# Patient Record
Sex: Male | Born: 1955 | Race: White | Hispanic: No | Smoking: Current every day smoker
Health system: Southern US, Community
[De-identification: ages and names within clinical notes are randomized; demographics above are authoritative.]

## PROBLEM LIST (undated history)

## (undated) DIAGNOSIS — K7460 Unspecified cirrhosis of liver: Secondary | ICD-10-CM

## (undated) DIAGNOSIS — R270 Ataxia, unspecified: Secondary | ICD-10-CM

## (undated) DIAGNOSIS — B1920 Unspecified viral hepatitis C without hepatic coma: Secondary | ICD-10-CM

## (undated) DIAGNOSIS — I10 Essential (primary) hypertension: Secondary | ICD-10-CM

## (undated) HISTORY — DX: Ataxia, unspecified: R27.0

## (undated) HISTORY — DX: Unspecified viral hepatitis C without hepatic coma: B19.20

## (undated) HISTORY — DX: Porphyria cutanea tarda: E80.1

## (undated) HISTORY — DX: Unspecified cirrhosis of liver: K74.60

## (undated) HISTORY — DX: Essential (primary) hypertension: I10

---

## 2014-05-07 ENCOUNTER — Encounter (INDEPENDENT_AMBULATORY_CARE_PROVIDER_SITE_OTHER): Payer: Self-pay | Admitting: *Deleted

## 2014-08-05 ENCOUNTER — Ambulatory Visit (INDEPENDENT_AMBULATORY_CARE_PROVIDER_SITE_OTHER): Payer: Self-pay | Admitting: Internal Medicine

## 2014-09-09 ENCOUNTER — Telehealth (INDEPENDENT_AMBULATORY_CARE_PROVIDER_SITE_OTHER): Payer: Self-pay | Admitting: *Deleted

## 2014-09-09 ENCOUNTER — Encounter (INDEPENDENT_AMBULATORY_CARE_PROVIDER_SITE_OTHER): Payer: Self-pay | Admitting: *Deleted

## 2014-09-09 NOTE — Telephone Encounter (Signed)
Ebon No Showed for his apt with Dr. Laural Golden on 08/05/14. A NS letter has been mailed.

## 2014-09-09 NOTE — Telephone Encounter (Signed)
Noted  

## 2014-10-23 ENCOUNTER — Encounter (INDEPENDENT_AMBULATORY_CARE_PROVIDER_SITE_OTHER): Payer: Self-pay | Admitting: *Deleted

## 2014-11-08 ENCOUNTER — Encounter (INDEPENDENT_AMBULATORY_CARE_PROVIDER_SITE_OTHER): Payer: Self-pay | Admitting: *Deleted

## 2014-11-08 ENCOUNTER — Encounter (INDEPENDENT_AMBULATORY_CARE_PROVIDER_SITE_OTHER): Payer: Self-pay | Admitting: Internal Medicine

## 2014-11-08 ENCOUNTER — Ambulatory Visit (INDEPENDENT_AMBULATORY_CARE_PROVIDER_SITE_OTHER): Payer: 59 | Admitting: Internal Medicine

## 2014-11-08 DIAGNOSIS — B1920 Unspecified viral hepatitis C without hepatic coma: Secondary | ICD-10-CM

## 2014-11-08 DIAGNOSIS — I10 Essential (primary) hypertension: Secondary | ICD-10-CM | POA: Insufficient documentation

## 2014-11-08 LAB — HEPATIC FUNCTION PANEL
ALT: 171 U/L — AB (ref 0–53)
AST: 145 U/L — ABNORMAL HIGH (ref 0–37)
Albumin: 3.9 g/dL (ref 3.5–5.2)
Alkaline Phosphatase: 85 U/L (ref 39–117)
BILIRUBIN DIRECT: 0.3 mg/dL (ref 0.0–0.3)
Indirect Bilirubin: 0.7 mg/dL (ref 0.2–1.2)
TOTAL PROTEIN: 7.4 g/dL (ref 6.0–8.3)
Total Bilirubin: 1 mg/dL (ref 0.2–1.2)

## 2014-11-08 LAB — CBC WITH DIFFERENTIAL/PLATELET
BASOS PCT: 2 % — AB (ref 0–1)
Basophils Absolute: 0.1 10*3/uL (ref 0.0–0.1)
EOS PCT: 1 % (ref 0–5)
Eosinophils Absolute: 0.1 10*3/uL (ref 0.0–0.7)
HEMATOCRIT: 45.7 % (ref 39.0–52.0)
HEMOGLOBIN: 16.1 g/dL (ref 13.0–17.0)
Lymphocytes Relative: 40 % (ref 12–46)
Lymphs Abs: 2.5 10*3/uL (ref 0.7–4.0)
MCH: 32.9 pg (ref 26.0–34.0)
MCHC: 35.2 g/dL (ref 30.0–36.0)
MCV: 93.3 fL (ref 78.0–100.0)
MPV: 11.9 fL (ref 8.6–12.4)
Monocytes Absolute: 0.9 10*3/uL (ref 0.1–1.0)
Monocytes Relative: 14 % — ABNORMAL HIGH (ref 3–12)
NEUTROS PCT: 43 % (ref 43–77)
Neutro Abs: 2.7 10*3/uL (ref 1.7–7.7)
PLATELETS: 174 10*3/uL (ref 150–400)
RBC: 4.9 MIL/uL (ref 4.22–5.81)
RDW: 13.5 % (ref 11.5–15.5)
WBC: 6.2 10*3/uL (ref 4.0–10.5)

## 2014-11-08 LAB — PROTIME-INR
INR: 1.09 (ref <1.50)
Prothrombin Time: 14.1 seconds (ref 11.6–15.2)

## 2014-11-08 NOTE — Patient Instructions (Signed)
Labs today. OV pending.
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
## 2014-11-09 LAB — AFP TUMOR MARKER: AFP-Tumor Marker: 13.8 ng/mL — ABNORMAL HIGH (ref <6.1)

## 2014-11-11 LAB — HEPATITIS C RNA QUANTITATIVE
HCV QUANT: 434504 [IU]/mL — AB (ref <15)
HCV Quantitative Log: 5.64 {Log} — ABNORMAL HIGH (ref <1.18)

## 2014-11-12 ENCOUNTER — Other Ambulatory Visit (HOSPITAL_COMMUNITY): Payer: Self-pay | Admitting: Family Medicine

## 2014-11-13 ENCOUNTER — Encounter (INDEPENDENT_AMBULATORY_CARE_PROVIDER_SITE_OTHER): Payer: Self-pay

## 2014-11-13 ENCOUNTER — Ambulatory Visit (HOSPITAL_COMMUNITY): Admission: RE | Admit: 2014-11-13 | Payer: 59 | Source: Ambulatory Visit

## 2014-11-14 ENCOUNTER — Ambulatory Visit (HOSPITAL_COMMUNITY)
Admission: RE | Admit: 2014-11-14 | Discharge: 2014-11-14 | Disposition: A | Discharge status: Home / Self Care | Payer: 59 | Source: Ambulatory Visit | Attending: Internal Medicine | Admitting: Internal Medicine

## 2014-11-14 DIAGNOSIS — B1920 Unspecified viral hepatitis C without hepatic coma: Secondary | ICD-10-CM | POA: Insufficient documentation

## 2015-03-20 ENCOUNTER — Encounter (INDEPENDENT_AMBULATORY_CARE_PROVIDER_SITE_OTHER): Payer: Self-pay | Admitting: Internal Medicine

## 2015-03-20 ENCOUNTER — Ambulatory Visit (INDEPENDENT_AMBULATORY_CARE_PROVIDER_SITE_OTHER): Payer: 59 | Admitting: Internal Medicine

## 2015-03-20 VITALS — BP 140/82 | HR 80 | Temp 98.1°F | Ht 73.0 in | Wt 198.8 lb

## 2015-03-20 DIAGNOSIS — B1710 Acute hepatitis C without hepatic coma: Secondary | ICD-10-CM | POA: Diagnosis not present

## 2015-03-20 LAB — CBC WITH DIFFERENTIAL/PLATELET
BASOS PCT: 1 % (ref 0–1)
Basophils Absolute: 0.1 10*3/uL (ref 0.0–0.1)
EOS ABS: 0.1 10*3/uL (ref 0.0–0.7)
Eosinophils Relative: 1 % (ref 0–5)
HCT: 46 % (ref 39.0–52.0)
Hemoglobin: 16 g/dL (ref 13.0–17.0)
Lymphocytes Relative: 36 % (ref 12–46)
Lymphs Abs: 2.4 10*3/uL (ref 0.7–4.0)
MCH: 32.7 pg (ref 26.0–34.0)
MCHC: 34.8 g/dL (ref 30.0–36.0)
MCV: 94.1 fL (ref 78.0–100.0)
MPV: 11.8 fL (ref 8.6–12.4)
Monocytes Absolute: 1.2 10*3/uL — ABNORMAL HIGH (ref 0.1–1.0)
Monocytes Relative: 18 % — ABNORMAL HIGH (ref 3–12)
NEUTROS ABS: 2.9 10*3/uL (ref 1.7–7.7)
Neutrophils Relative %: 44 % (ref 43–77)
Platelets: 161 10*3/uL (ref 150–400)
RBC: 4.89 MIL/uL (ref 4.22–5.81)
RDW: 13.6 % (ref 11.5–15.5)
WBC: 6.6 10*3/uL (ref 4.0–10.5)

## 2015-03-20 LAB — HEPATIC FUNCTION PANEL
ALK PHOS: 84 U/L (ref 40–115)
ALT: 123 U/L — ABNORMAL HIGH (ref 9–46)
AST: 106 U/L — ABNORMAL HIGH (ref 10–35)
Albumin: 4.2 g/dL (ref 3.6–5.1)
BILIRUBIN DIRECT: 0.3 mg/dL — AB (ref <=0.2)
BILIRUBIN TOTAL: 0.8 mg/dL (ref 0.2–1.2)
Indirect Bilirubin: 0.5 mg/dL (ref 0.2–1.2)
Total Protein: 7.9 g/dL (ref 6.1–8.1)

## 2015-03-20 NOTE — Patient Instructions (Signed)
OV pending.  
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
## 2015-03-21 LAB — DRUG SCREEN, URINE
AMPHETAMINE SCRN UR: NEGATIVE
BENZODIAZEPINES.: NEGATIVE
Barbiturate Quant, Ur: NEGATIVE
Cocaine Metabolites: NEGATIVE
Creatinine,U: 17.86 mg/dL
Marijuana Metabolite: POSITIVE — AB
Methadone: NEGATIVE
Opiates: NEGATIVE
PHENCYCLIDINE (PCP): NEGATIVE
Propoxyphene: NEGATIVE

## 2015-03-21 LAB — HEPATITIS B SURFACE ANTIGEN: Hepatitis B Surface Ag: NEGATIVE

## 2015-03-23 LAB — HEPATITIS C RNA QUANTITATIVE
HCV QUANT: 197984 [IU]/mL — AB (ref <15)
HCV Quantitative Log: 5.3 {Log} — ABNORMAL HIGH (ref <1.18)

## 2015-03-24 ENCOUNTER — Telehealth (INDEPENDENT_AMBULATORY_CARE_PROVIDER_SITE_OTHER): Payer: Self-pay | Admitting: Internal Medicine

## 2015-03-24 NOTE — Telephone Encounter (Signed)
Will either treat with Harvoni x 12 weeks or Viekira x 12 weeks. Which ever his insurance will pay for

## 2015-03-26 NOTE — Telephone Encounter (Signed)
I have completed a PA to be sent to [place] [facility], so that they can complete the PA process. Once they have processed the request and let us know the status, we will let the patient know.

## 2015-05-02 ENCOUNTER — Telehealth (INDEPENDENT_AMBULATORY_CARE_PROVIDER_SITE_OTHER): Payer: Self-pay | Admitting: *Deleted

## 2015-05-02 ENCOUNTER — Telehealth (INDEPENDENT_AMBULATORY_CARE_PROVIDER_SITE_OTHER): Payer: Self-pay | Admitting: Internal Medicine

## 2015-05-02 DIAGNOSIS — R825 Elevated urine levels of drugs, medicaments and biological substances: Secondary | ICD-10-CM

## 2015-05-02 DIAGNOSIS — B182 Chronic viral hepatitis C: Secondary | ICD-10-CM

## 2015-05-02 NOTE — Telephone Encounter (Signed)
Spoke with patient. He had a positive drug screen. Will repeat drug screen in November (30 days). If negative, will resubmit to [facility] with Epclusa x 12 weeks.

## 2015-05-02 NOTE — Telephone Encounter (Signed)
Urine drug test noted for 1 month.

## 2015-05-02 NOTE — Telephone Encounter (Signed)
.  Per [name] patient to have a repeat urine drug screen in 30 days. If negative, we will resubmit and request Epclusa for 12 weeks.

## 2015-05-08 ENCOUNTER — Encounter (INDEPENDENT_AMBULATORY_CARE_PROVIDER_SITE_OTHER): Payer: Self-pay | Admitting: *Deleted

## 2015-05-08 ENCOUNTER — Other Ambulatory Visit (INDEPENDENT_AMBULATORY_CARE_PROVIDER_SITE_OTHER): Payer: Self-pay | Admitting: *Deleted

## 2015-05-08 DIAGNOSIS — R825 Elevated urine levels of drugs, medicaments and biological substances: Secondary | ICD-10-CM

## 2015-05-08 DIAGNOSIS — B182 Chronic viral hepatitis C: Secondary | ICD-10-CM

## 2016-04-06 ENCOUNTER — Emergency Department (HOSPITAL_COMMUNITY): Payer: 59

## 2016-04-06 ENCOUNTER — Emergency Department (HOSPITAL_COMMUNITY)
Admission: EM | Admit: 2016-04-06 | Discharge: 2016-04-06 | Disposition: A | Discharge status: Home / Self Care | Payer: 59 | Attending: Emergency Medicine | Admitting: Emergency Medicine

## 2016-04-06 ENCOUNTER — Encounter (HOSPITAL_COMMUNITY): Payer: Self-pay | Admitting: Emergency Medicine

## 2016-04-06 DIAGNOSIS — M5412 Radiculopathy, cervical region: Secondary | ICD-10-CM | POA: Diagnosis not present

## 2016-04-06 DIAGNOSIS — F17210 Nicotine dependence, cigarettes, uncomplicated: Secondary | ICD-10-CM | POA: Insufficient documentation

## 2016-04-06 DIAGNOSIS — B182 Chronic viral hepatitis C: Secondary | ICD-10-CM

## 2016-04-06 DIAGNOSIS — I10 Essential (primary) hypertension: Secondary | ICD-10-CM | POA: Insufficient documentation

## 2016-04-06 DIAGNOSIS — Z79899 Other long term (current) drug therapy: Secondary | ICD-10-CM | POA: Diagnosis not present

## 2016-04-06 DIAGNOSIS — R200 Anesthesia of skin: Secondary | ICD-10-CM | POA: Diagnosis present

## 2016-04-06 LAB — COMPREHENSIVE METABOLIC PANEL
ALT: 40 U/L (ref 17–63)
AST: 60 U/L — ABNORMAL HIGH (ref 15–41)
Albumin: 3 g/dL — ABNORMAL LOW (ref 3.5–5.0)
Alkaline Phosphatase: 97 U/L (ref 38–126)
Anion gap: 8 (ref 5–15)
BUN: 13 mg/dL (ref 6–20)
CHLORIDE: 100 mmol/L — AB (ref 101–111)
CO2: 24 mmol/L (ref 22–32)
Calcium: 8.9 mg/dL (ref 8.9–10.3)
Creatinine, Ser: 0.83 mg/dL (ref 0.61–1.24)
GFR calc non Af Amer: >60 mL/min (ref >60)
Glucose, Bld: 100 mg/dL — ABNORMAL HIGH (ref 65–99)
Potassium: 3.4 mmol/L — ABNORMAL LOW (ref 3.5–5.1)
Sodium: 132 mmol/L — ABNORMAL LOW (ref 135–145)
Total Bilirubin: 0.9 mg/dL (ref 0.3–1.2)
Total Protein: 8.3 g/dL — ABNORMAL HIGH (ref 6.5–8.1)

## 2016-04-06 LAB — CBC
HCT: 42.3 % (ref 39.0–52.0)
Hemoglobin: 15 g/dL (ref 13.0–17.0)
MCH: 33.7 pg (ref 26.0–34.0)
MCHC: 35.5 g/dL (ref 30.0–36.0)
MCV: 95.1 fL (ref 78.0–100.0)
PLATELETS: 260 10*3/uL (ref 150–400)
RBC: 4.45 MIL/uL (ref 4.22–5.81)
RDW: 13.1 % (ref 11.5–15.5)
WBC: 9.2 10*3/uL (ref 4.0–10.5)

## 2016-04-06 LAB — MAGNESIUM: Magnesium: 2 mg/dL (ref 1.7–2.4)

## 2016-04-06 LAB — PROTIME-INR
INR: 1.06
PROTHROMBIN TIME: 13.8 s (ref 11.4–15.2)

## 2016-04-06 LAB — URINALYSIS, ROUTINE W REFLEX MICROSCOPIC
Glucose, UA: NEGATIVE mg/dL
HGB URINE DIPSTICK: NEGATIVE
Ketones, ur: NEGATIVE mg/dL
Leukocytes, UA: NEGATIVE
Nitrite: NEGATIVE
PROTEIN: NEGATIVE mg/dL
SPECIFIC GRAVITY, URINE: 1.025 (ref 1.005–1.030)
pH: 5.5 (ref 5.0–8.0)

## 2016-04-06 LAB — TROPONIN I

## 2016-04-06 MED ORDER — ONDANSETRON HCL 4 MG/2ML IJ SOLN
4.0000 mg | Freq: Once | INTRAMUSCULAR | Status: AC
  Administered 2016-04-06: 4 mg via INTRAVENOUS
  Filled 2016-04-06: qty 2

## 2016-04-06 MED ORDER — PREDNISONE 50 MG PO TABS
60.0000 mg | ORAL_TABLET | Freq: Once | ORAL | Status: AC
  Administered 2016-04-06: 60 mg via ORAL
  Filled 2016-04-06: qty 1

## 2016-04-06 MED ORDER — MORPHINE SULFATE (PF) 4 MG/ML IV SOLN
4.0000 mg | Freq: Once | INTRAVENOUS | Status: AC
Start: 2016-04-06 — End: 2016-04-06
  Administered 2016-04-06: 4 mg via INTRAVENOUS
  Filled 2016-04-06: qty 1

## 2016-04-06 MED ORDER — SODIUM CHLORIDE 0.9 % IV SOLN: INTRAVENOUS | Status: DC

## 2016-04-06 MED ORDER — PREDNISONE 10 MG (21) PO TBPK: 10.0000 mg | ORAL_TABLET | Freq: Every day | ORAL | 0 refills | Status: DC

## 2016-04-06 MED ORDER — SODIUM CHLORIDE 0.9 % IV BOLUS (SEPSIS)
1000.0000 mL | Freq: Once | INTRAVENOUS | Status: AC
  Administered 2016-04-06: 1000 mL via INTRAVENOUS

## 2016-04-06 NOTE — ED Triage Notes (Signed)
Pt reports right arm numbness, right neck pain, right rib pain since yesterday.  Pt is alert and oriented, denies slurred speech, blurred vision.

## 2016-04-06 NOTE — ED Notes (Signed)
Patient transported to X-ray 

## 2016-04-06 NOTE — ED Provider Notes (Signed)
[facility] DEPT Provider Note   CSN: [id number] Arrival date & time: 04/06/16  1317     History   Chief Complaint Chief Complaint  Patient presents with  . Numbness    HPI Joseph Duffy is a 60 y.o. male.  Pt presents to the ED today with right arm pain, right neck pain, right chest wall pain, and ruq pain.  The pt has hepatitis C and is followed by Dr. Elease Hashimoto (GI at [facility]).  This is his most recent note from 03/31/16.  HPI: Joseph Duffy is a 59 y.o. year old male who comes for advice regarding chronic hepatitis C and PCT. Patient developed blisters on backs of hands about 7 y ago. Dx of PCT was made; this led to testing for HCV, which was + with GT 1b. Rx--Peg-IFN + RBV for about a year. Pt had severe--and her feels, still ongoing side-effects of this treatment--just feels poorly; trouble thinking, etc. He had NR to IFN-likely Rx should have been stopped earlier--due to lack of response, but wasn't. 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 Pt does not recall having had iron removal nor HCQ for PCT Rx--only IFN + RBV. (Avid and excellent player of golf--handicap still now in single digits--was around 2-3 back in his prime. Denies jaundice; never vacc vs HAV or HBV. Has had nl colos; no EGD done. He thinks that he still has active PCT with new lesions cropping up on hands and forearms after minimal trauma--sores and blisters that are very slow to heal. He is interested in pursuing DAA Rx of HCV. I explained that we have a possible clinical trial for PCT with HCV due to HCV GT 1, 4, 5, or 6--sponsored by [employer]. He is interested to enroll--if he proves to q ualify--main criterion is that urinary porphyrin level must be at least 800 mcg/g creatinine in a random spot urine--we obtain urine and plasma today for porphyrins and also blood for RBC UROD activity.   Pt denies any recent trauma.  He is  worried that his liver is inflamed or that he is having a heart attack.  Sx started yesterday.  He denies any f/c, n/v.          Past Medical History:  Diagnosis Date  . Hepatitis C   . Hypertension     Patient Active Problem List   Diagnosis Date Noted  . Essential hypertension 11/08/2014  . Hepatitis C 11/08/2014    No past surgical history on file.     Home Medications    Prior to Admission medications   Medication Sig Start Date End Date Taking? Authorizing Provider  amLODipine-benazepril (LOTREL) 5-10 MG per capsule Take 1 capsule by mouth daily.    Historical Provider, MD  predniSONE (STERAPRED UNI-PAK 21 TAB) 10 MG (21) TBPK tablet Take 1 tablet (10 mg total) by mouth daily. Take 6 tabs by mouth daily  for 2 days, then 5 tabs for 2 days, then 4 tabs for 2 days, then 3 tabs for 2 days, 2 tabs for 2 days, then 1 tab by mouth daily for 2 days 04/06/16   Isla Pence, MD    Family History No family history on file.  Social History Social History  Substance Use Topics  . Smoking status: Current Every Day Smoker    Packs/day: 0.50    Types: Cigarettes  . Smokeless tobacco: Not on file  . Alcohol use 0.0 oz/week  Comment: beer or two on the weekend     Allergies   Review of patient's allergies indicates no known allergies.   Review of Systems Review of Systems  Cardiovascular: Positive for chest pain.  Musculoskeletal: Positive for neck pain.  Neurological: Positive for numbness.  All other systems reviewed and are negative.    Physical Exam Updated Vital Signs BP 133/94   Pulse 74   Temp 99.3 F (37.4 C) (Oral)   Resp 24   Ht 6' (1.829 m)   Wt 195 lb (88.5 kg)   SpO2 100%   BMI 26.45 kg/m   Physical Exam  Constitutional: He is oriented to person, place, and time. He appears well-developed and well-nourished.  HENT:  Head: Normocephalic and atraumatic.  Right Ear: External ear normal.  Left Ear: External ear normal.  Nose: Nose  normal.  Mouth/Throat: Oropharynx is clear and moist.  Eyes: Conjunctivae and EOM are normal. Pupils are equal, round, and reactive to light.  Neck: Normal range of motion. Neck supple.  Cardiovascular: Normal rate, regular rhythm, normal heart sounds and intact distal pulses.   Pulmonary/Chest: Effort normal and breath sounds normal.  Abdominal: Soft. Bowel sounds are normal. There is hepatomegaly.  Musculoskeletal: Normal range of motion.  Neurological: He is alert and oriented to person, place, and time.  Skin: Skin is warm and dry.  Psychiatric: He has a normal mood and affect. His behavior is normal. Judgment and thought content normal.  Nursing note and vitals reviewed.    ED Treatments / Results  Labs (all labs ordered are listed, but only abnormal results are displayed) Labs Reviewed  COMPREHENSIVE METABOLIC PANEL - Abnormal; Notable for the following:       Result Value   Sodium 132 (*)    Potassium 3.4 (*)    Chloride 100 (*)    Glucose, Bld 100 (*)    Total Protein 8.3 (*)    Albumin 3.0 (*)    AST 60 (*)    All other components within normal limits  URINALYSIS, ROUTINE W REFLEX MICROSCOPIC (NOT AT [facility]) - Abnormal; Notable for the following:    Bilirubin Urine MODERATE (*)    All other components within normal limits  CBC  PROTIME-INR  MAGNESIUM  TROPONIN I    EKG  EKG Interpretation  Date/Time:  Tuesday April 06 2016 13:24:35 EDT Ventricular Rate:  84 PR Interval:    QRS Duration: 107 QT Interval:  359 QTC Calculation: 425 R Axis:   -62 Text Interpretation:  Sinus rhythm Left anterior fascicular block Probable anteroseptal infarct, old No old tracing to compare Confirmed by [facility] MD, Estill Llerena [phone]) on 04/06/2016 1:54:43 PM       Radiology Dg Chest 2 View  Result Date: 04/06/2016 CLINICAL DATA:  Right-sided neck pain radiating into the shoulder and right-sided chest pain. No known injury. Current smoker. EXAM: CHEST  2 VIEW COMPARISON:   09/26/2010. FINDINGS: Suboptimal inspiration accounts for crowded bronchovascular markings diffusely and atelectasis in the bases, and accentuates the cardiac silhouette. Taking this into account, cardiac silhouette normal in size. Thoracic aorta minimally atherosclerotic. Hilar and mediastinal contours otherwise unremarkable. Densely calcified granuloma or hamartoma in the lingula, unchanged. Lungs otherwise clear. No localized airspace consolidation. No pleural effusions. No pneumothorax. Normal pulmonary vascularity. Visualized bony thorax intact. IMPRESSION: 1. Suboptimal inspiration accounts for mild bibasilar atelectasis. No acute cardiopulmonary disease otherwise. 2. Stable densely calcified granuloma or hamartoma in the lingula. 3. Mild thoracic aortic atherosclerosis. Electronically Signed   By:  Evangeline Dakin M.D.   On: 04/06/2016 14:48   Dg Cervical Spine Complete  Result Date: 04/06/2016 CLINICAL DATA:  Cervicalgia with pain extending into right shoulder EXAM: CERVICAL SPINE - COMPLETE 4+ VIEW COMPARISON:  None. FINDINGS: Frontal, lateral, open-mouth odontoid, and bilateral oblique views were obtained. There is no fracture or spondylolisthesis. Prevertebral soft tissues and predental space regions are normal. There is moderately severe disc space narrowing at C6-7 and C7-T1. There is facet hypertrophy with exit foraminal narrowing at C6-7 and C7-T1 bilaterally. There is a small focus of calcification in the left carotid artery. 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 No acute fracture or spondylolisthesis. Small focus of calcification in the left carotid artery. Electronically Signed   By: Lowella Grip III M.D.   On: 04/06/2016 14:46    Procedures Procedures (including critical care time)  Medications Ordered in ED Medications  sodium chloride 0.9 % bolus 1,000 mL (1,000 mLs Intravenous New Bag/Given 04/06/16 1411)    And  0.9 %  sodium chloride infusion (not  administered)  predniSONE (DELTASONE) tablet 60 mg (not administered)  morphine 4 MG/ML injection 4 mg (4 mg Intravenous Given 04/06/16 1411)  ondansetron (ZOFRAN) injection 4 mg (4 mg Intravenous Given 04/06/16 1415)     Initial Impression / Assessment and Plan / ED Course  I have reviewed the triage vital signs and the nursing notes.  Pertinent labs & imaging results that were available during my care of the patient were reviewed by me and considered in my medical decision making (see chart for details).  Clinical Course   Pt's LFTs are stable.  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  He knows to f/u with his pcp and to return if worse.  Final Clinical Impressions(s) / ED Diagnoses   Final diagnoses:  Cervical radiculopathy  Chronic hepatitis C without hepatic coma (HCC)    New Prescriptions New Prescriptions   PREDNISONE (STERAPRED UNI-PAK 21 TAB) 10 MG (21) TBPK TABLET    Take 1 tablet (10 mg total) by mouth daily. Take 6 tabs by mouth daily  for 2 days, then 5 tabs for 2 days, then 4 tabs for 2 days, then 3 tabs for 2 days, 2 tabs for 2 days, then 1 tab by mouth daily for 2 days     Isla Pence, MD 04/06/16 1500

## 2018-02-22 IMAGING — DX DG CERVICAL SPINE COMPLETE 4+V
5 series · 5 of 5 positions shown · non-contrast
Comparison: None.

CLINICAL DATA: Cervicalgia with pain extending into right shoulder

EXAM:
CERVICAL SPINE - COMPLETE 4+ VIEW

[c-spine lat]
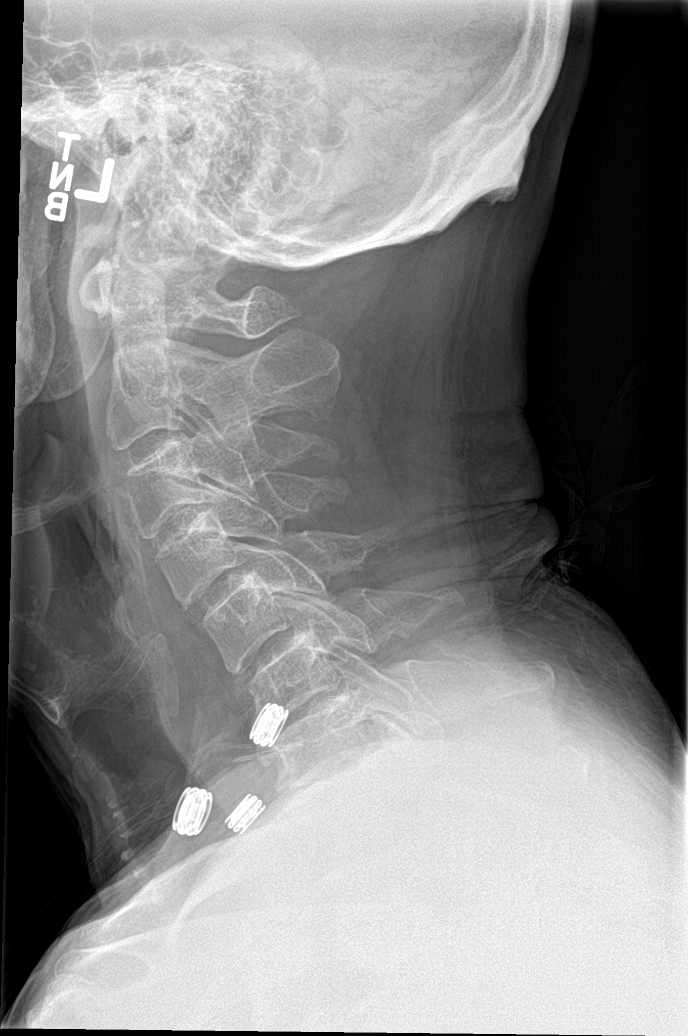

[c-spine obl (1 of 2)]
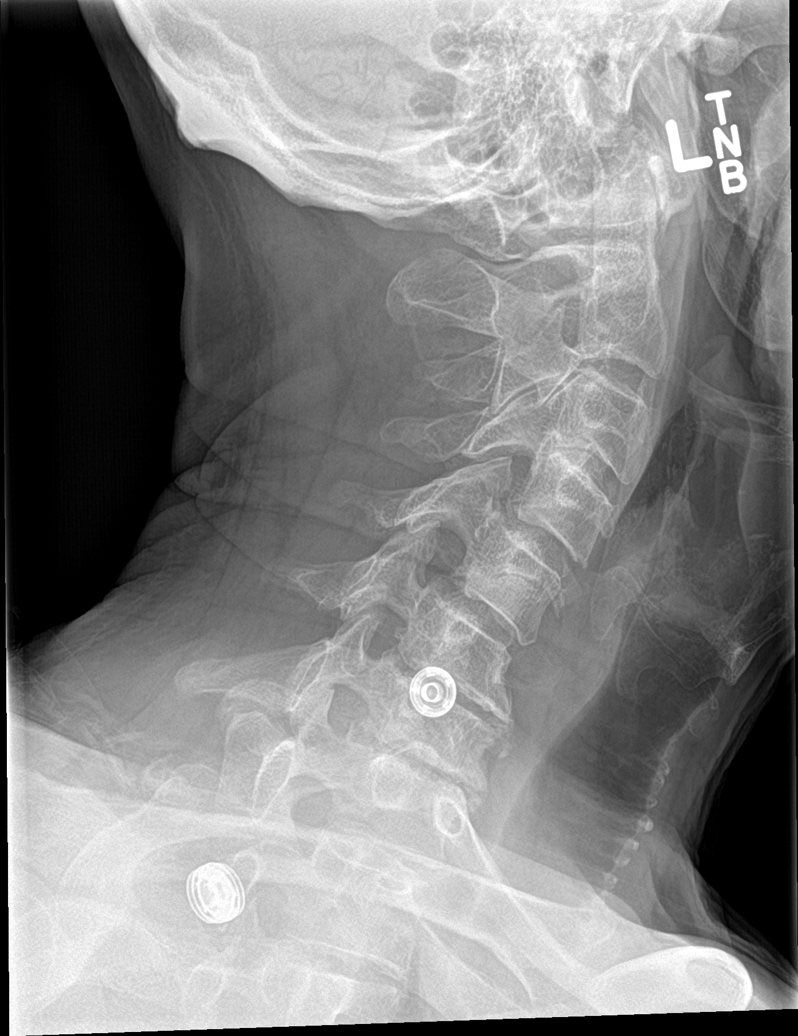

[c-spine obl (2 of 2)]
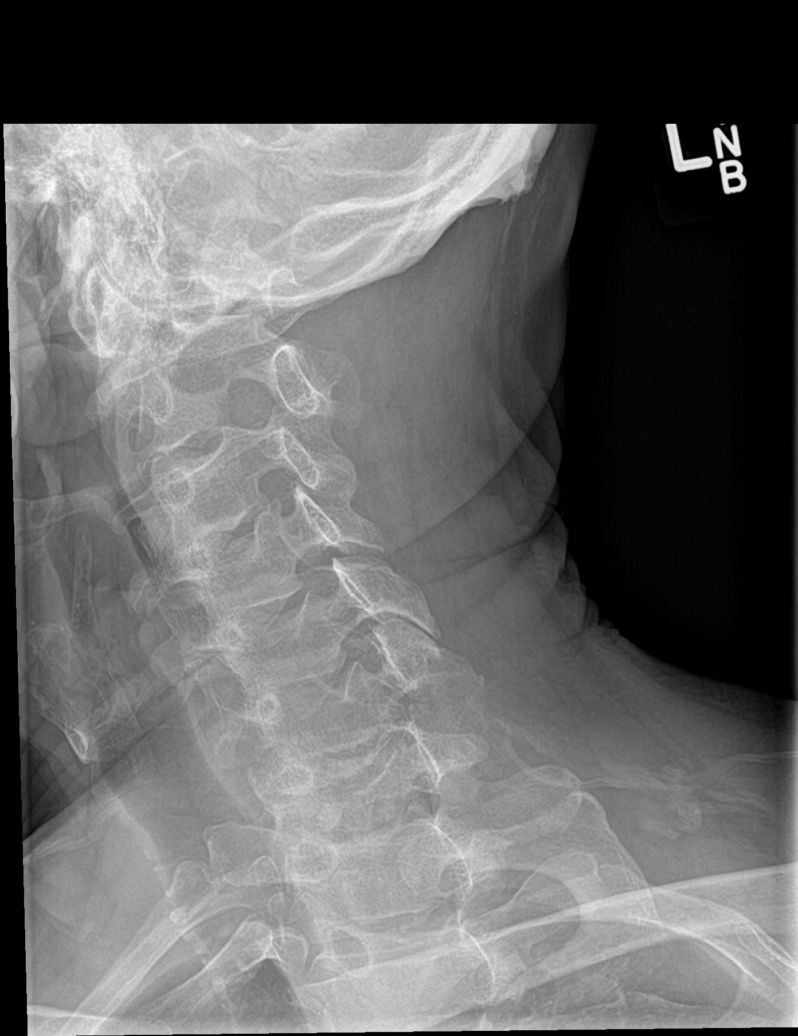

[c-spine ap]
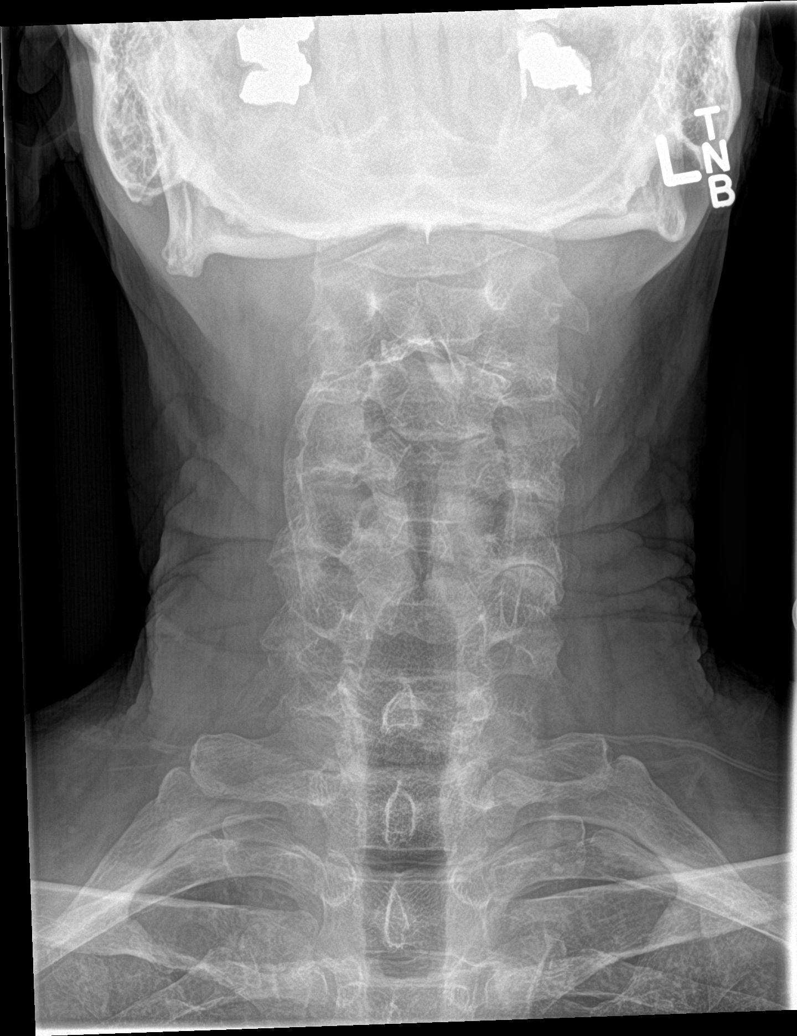

[c-spine open mouth]
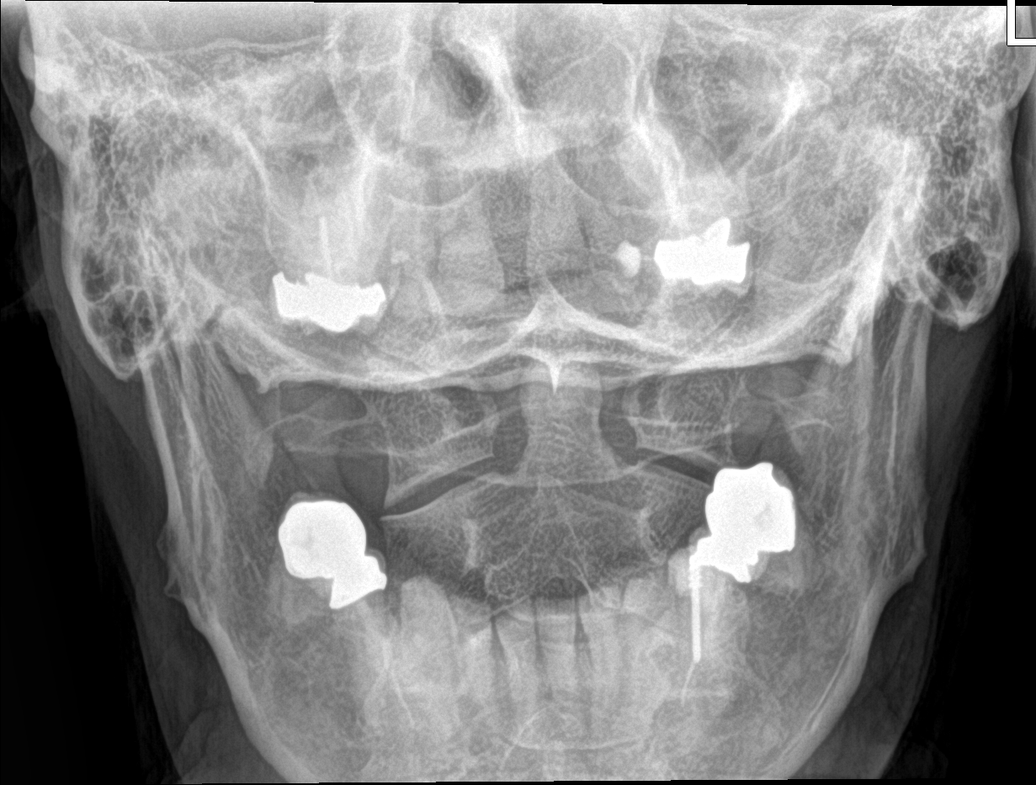

[5 of 5 positions shown; findings below may reference images not displayed]

FINDINGS: Frontal, lateral, open-mouth odontoid, and bilateral oblique views
were obtained. There is no fracture or spondylolisthesis.
Prevertebral soft tissues and predental space regions are normal.
There is moderately severe disc space narrowing at C6-7 and C7-T1.
There is facet hypertrophy with exit foraminal narrowing at C6-7 and
C7-T1 bilaterally. There is a small focus of calcification in the
left carotid artery.
IMPRESSION: Osteoarthritic change at C6-7 and C7-T1. No acute fracture or
spondylolisthesis. Small focus of calcification in the left carotid
artery.

## 2019-09-09 ENCOUNTER — Other Ambulatory Visit: Payer: Self-pay

## 2019-09-09 ENCOUNTER — Ambulatory Visit: Payer: 59 | Attending: Internal Medicine

## 2019-09-09 DIAGNOSIS — Z23 Encounter for immunization: Secondary | ICD-10-CM | POA: Insufficient documentation

## 2019-09-09 NOTE — Progress Notes (Signed)
   Covid-19 Vaccination Clinic  Name:  Joseph Duffy    MRN: [id number] DOB: [birth year]  09/09/2019  Joseph Duffy was observed post Covid-19 immunization for 15 minutes without incidence. He was provided with Vaccine Information Sheet and instruction to access the V-Safe system.   Joseph Duffy was instructed to call 911 with any severe reactions post vaccine: Marland Kitchen Difficulty breathing  . Swelling of your face and throat  . A fast heartbeat  . A bad rash all over your body  . Dizziness and weakness    Immunizations Administered    Name Date Dose VIS Date Route   Moderna COVID-19 Vaccine 09/09/2019  2:35 PM 0.5 mL 06/12/2019 Intramuscular   Manufacturer: Moderna   Lot: [id number]   [id number]

## 2019-10-13 ENCOUNTER — Ambulatory Visit: Payer: 59 | Attending: Internal Medicine

## 2019-10-13 DIAGNOSIS — Z23 Encounter for immunization: Secondary | ICD-10-CM

## 2019-10-13 NOTE — Progress Notes (Signed)
   Covid-19 Vaccination Clinic  Name:  Joseph Duffy    MRN: [id number] DOB: Feb 20, 1956  10/13/2019  [name] was observed post Covid-19 immunization for 15 minutes without incident. He was provided with Vaccine Information Sheet and instruction to access the V-Safe system.   [name] was instructed to call 911 with any severe reactions post vaccine: Marland Kitchen Difficulty breathing  . Swelling of face and throat  . A fast heartbeat  . A bad rash all over body  . Dizziness and weakness   Immunizations Administered    Name Date Dose VIS Date Route   Moderna COVID-19 Vaccine 10/13/2019 12:50 PM 0.5 mL 06/12/2019 Intramuscular   Manufacturer: Moderna   Lot: [id number]   [place][id number]

## 2019-11-19 ENCOUNTER — Encounter: Payer: Self-pay | Admitting: *Deleted

## 2019-11-20 ENCOUNTER — Encounter: Payer: Self-pay | Admitting: Diagnostic Neuroimaging

## 2019-11-20 ENCOUNTER — Ambulatory Visit: Payer: BC Managed Care – PPO | Admitting: Diagnostic Neuroimaging

## 2019-11-20 ENCOUNTER — Other Ambulatory Visit: Payer: Self-pay

## 2019-11-20 VITALS — BP 157/82 | HR 61 | Temp 98.3°F | Ht 72.0 in | Wt 216.4 lb

## 2019-11-20 DIAGNOSIS — G3281 Cerebellar ataxia in diseases classified elsewhere: Secondary | ICD-10-CM | POA: Diagnosis not present

## 2019-11-20 DIAGNOSIS — R269 Unspecified abnormalities of gait and mobility: Secondary | ICD-10-CM

## 2019-11-20 NOTE — Progress Notes (Signed)
[facility]  PATIENT: Joseph Duffy DOB: 03-20-1956  REFERRING CLINICIAN: Glenda Chroman, MD HISTORY FROM: patient  REASON FOR VISIT: new consult    HISTORICAL  CHIEF COMPLAINT:  Chief Complaint  Patient presents with  . Ataxia    rm 6 New Pt "started 6 months ago, inability to get still, my legs wobble, it's not constantly"    HISTORY OF PRESENT ILLNESS:   64 year old male here for evaluation of gait and balance difficulty.  Symptoms started 6 to 8 months ago.  He describes some difficulty with balance coordination especially with playing golf.  Symptoms walking he has to focus on intentionally taking steps so that he does not fall down.  He feels he is having to relearn how to walk.  He denies any numbness or weakness in his legs.  No problems with his arms.  No neck or low back pain.   REVIEW OF SYSTEMS: Full 14 system review of systems performed and negative with exception of: As per HPI.  ALLERGIES: No Known Allergies  HOME MEDICATIONS: Outpatient Medications Prior to Visit  Medication Sig Dispense Refill  . chlorthalidone (HYGROTON) 25 MG tablet Take by mouth.    Marland Kitchen amLODipine-benazepril (LOTREL) 5-10 MG per capsule Take 1 capsule by mouth daily.    . predniSONE (STERAPRED UNI-PAK 21 TAB) 10 MG (21) TBPK tablet Take 1 tablet (10 mg total) by mouth daily. Take 6 tabs by mouth daily  for 2 days, then 5 tabs for 2 days, then 4 tabs for 2 days, then 3 tabs for 2 days, 2 tabs for 2 days, then 1 tab by mouth daily for 2 days 42 tablet 0   No facility-administered medications prior to visit.    PAST MEDICAL HISTORY: Past Medical History:  Diagnosis Date  . Ataxia   . Cirrhosis of liver ([facility])   . Hepatitis C   . Hypertension   . 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 No pertinent surgical history.  FAMILY HISTORY: Family History  Problem Relation Age of Onset  . COPD Father     SOCIAL HISTORY: Social  History   Socioeconomic History  . Marital status: Divorced    Spouse name: Not on file  . Number of children: 2  . Years of education: Not on file  . Highest education level: Not on file  Occupational History    Comment: retired  Tobacco Use  . Smoking status: Current Every Day Smoker    Packs/day: 0.50    Years: 26.00    Pack years: 13.00    Types: Cigarettes  . Smokeless tobacco: Never Used  Substance and Sexual Activity  . Alcohol use: Yes    Alcohol/week: 0.0 standard drinks    Comment: beer or two on the weekend  . Drug use: No    Comment: marijuana  . Sexual activity: Not on file  Other Topics Concern  . Not on file  Social History Narrative   Lives with mom to care for her   Caffeine- coffee, Mtn Dew occas   Social Determinants of Health   Financial Resource Strain:   . Difficulty of Paying Living Expenses:   Food Insecurity:   . Worried About Charity fundraiser in the Last Year:   . Arboriculturist in the Last Year:   Transportation Needs:   . Film/video editor (Medical):   Marland Kitchen Lack of Transportation (Non-Medical):   Physical Activity:   .  Days of Exercise per Week:   . Minutes of Exercise per Session:   Stress:   . Feeling of Stress :   Social Connections:   . Frequency of Communication with Friends and Family:   . Frequency of Social Gatherings with Friends and Family:   . Attends Religious Services:   . Active Member of Clubs or Organizations:   . Attends Archivist Meetings:   Marland Kitchen Marital Status:   Intimate Partner Violence:   . Fear of Current or Ex-Partner:   . Emotionally Abused:   Marland Kitchen Physically Abused:   . Sexually Abused:      PHYSICAL EXAM  GENERAL EXAM/CONSTITUTIONAL: Vitals:  Vitals:   11/20/19 1440  BP: (!) 157/82  Pulse: 61  Temp: 98.3 F (36.8 C)  Weight: 216 lb 6.4 oz (98.2 kg)  Height: 6' (1.829 m)     Body mass index is 29.35 kg/m. Wt Readings from Last 3 Encounters:  11/20/19 216 lb 6.4 oz (98.2 kg)    04/06/16 195 lb (88.5 kg)  03/20/15 198 lb 12.8 oz (90.2 kg)     Patient is in no distress; well developed, nourished and groomed; neck is supple  CARDIOVASCULAR:  Examination of carotid arteries is normal; no carotid bruits  Regular rate and rhythm, no murmurs  Examination of peripheral vascular system by observation and palpation is normal  EYES:  Ophthalmoscopic exam of optic discs and posterior segments is normal; no papilledema or hemorrhages  No exam data present  MUSCULOSKELETAL:  Gait, strength, tone, movements noted in Neurologic exam below  NEUROLOGIC: MENTAL STATUS:  No flowsheet data found.  awake, alert, oriented to person, place and time  recent and remote memory intact  normal attention and concentration  language fluent, comprehension intact, naming intact  fund of knowledge appropriate  CRANIAL NERVE:   2nd - no papilledema on fundoscopic exam  2nd, 3rd, 4th, 6th - pupils equal and reactive to light, visual fields full to confrontation, extraocular muscles intact, no nystagmus  5th - facial sensation symmetric  7th - facial strength symmetric  8th - hearing intact  9th - palate elevates symmetrically, uvula midline  11th - shoulder shrug symmetric  12th - tongue protrusion midline  MOTOR:   normal bulk, full strength in the BUE, BLE  INCREASED TONE IN BUE  SENSORY:   normal and symmetric to light touch, pinprick, temperature, vibration; EXCEPT DECR TEMP AND VIB IN HANDS  COORDINATION:   finger-nose-finger, fine finger movements normal  REFLEXES:   deep tendon reflexes --> BUE 2; KNEES 1; ANKLES TRACE WITH REINFORCEMENT  GAIT/STATION:   MILD STEPPAGE GAIT; SLIGHT DIFF WITH HEEL AND TANDEM GAIT; ROMBERG NEG     DIAGNOSTIC DATA (LABS, IMAGING, TESTING) - I reviewed patient records, labs, notes, testing and imaging myself where available.  Lab Results  Component Value Date   WBC 9.2 04/06/2016   HGB 15.0 04/06/2016    HCT 42.3 04/06/2016   MCV 95.1 04/06/2016   PLT 260 04/06/2016      Component Value Date/Time   NA 132 (L) 04/06/2016 1346   K 3.4 (L) 04/06/2016 1346   CL 100 (L) 04/06/2016 1346   CO2 24 04/06/2016 1346   GLUCOSE 100 (H) 04/06/2016 1346   BUN 13 04/06/2016 1346   CREATININE 0.83 04/06/2016 1346   CALCIUM 8.9 04/06/2016 1346   PROT 8.3 (H) 04/06/2016 1346   ALBUMIN 3.0 (L) 04/06/2016 1346   AST 60 (H) 04/06/2016 1346   ALT 40 04/06/2016  1346   ALKPHOS 97 04/06/2016 1346   BILITOT 0.9 04/06/2016 1346   GFRNONAA >60 04/06/2016 1346   GFRAA >60 04/06/2016 1346   No results found for: CHOL, HDL, LDLCALC, LDLDIRECT, TRIG, CHOLHDL No results found for: HGBA1C No results found for: VITAMINB12 No results found for: TSH     ASSESSMENT AND PLAN  64 y.o. year old male here with new onset gradual and progressive gait and balance difficulty since September 2020, mainly noticed when patient is playing golf or walking long distances.  We will proceed with further work-up to rule out CNS etiology such as stroke, cervical myelopathy; also will check neuropathy labs.  Dx:  1. Gait difficulty   2. Cerebellar ataxia in diseases classified elsewhere [facility])     PLAN:  GAIT DIFFICULTY - check MRI brain, cervical spine - check neuropathy labs  Orders Placed This Encounter  Procedures  . MR BRAIN W WO CONTRAST  . MR CERVICAL SPINE W WO CONTRAST  . CBC with diff  . CMP  . Vitamin B12  . A1c  . TSH  . SPEP with IFE  . ANA w/Reflex  . SSA, SSB  . ESR  . CRP  . HIV  . Vitamin B1  . Vitamin B6  . Copper  . RPR   Return for pending if symptoms worsen or fail to improve.    Penni Bombard, MD 0/63/8685, 4:88 PM Certified in Neurology, Neurophysiology and Neuroimaging  [facility] [address] [place], [place] [address] [phone]

## 2019-11-20 NOTE — Patient Instructions (Signed)
-   check MRI brain, cervical spine  - check labs

## 2019-11-24 LAB — CBC WITH DIFFERENTIAL/PLATELET
Basophils Absolute: 0.1 10*3/uL (ref 0.0–0.2)
Basos: 1 %
EOS (ABSOLUTE): 0.1 10*3/uL (ref 0.0–0.4)
Eos: 1 %
Hematocrit: 46.4 % (ref 37.5–51.0)
Hemoglobin: 15.9 g/dL (ref 13.0–17.7)
Immature Grans (Abs): 0 10*3/uL (ref 0.0–0.1)
Immature Granulocytes: 0 %
Lymphocytes Absolute: 1.7 10*3/uL (ref 0.7–3.1)
Lymphs: 21 %
MCH: 32.1 pg (ref 26.6–33.0)
MCHC: 34.3 g/dL (ref 31.5–35.7)
MCV: 94 fL (ref 79–97)
Monocytes Absolute: 1 10*3/uL — ABNORMAL HIGH (ref 0.1–0.9)
Monocytes: 12 %
Neutrophils Absolute: 5.4 10*3/uL (ref 1.4–7.0)
Neutrophils: 65 %
Platelets: 169 10*3/uL (ref 150–450)
RBC: 4.95 x10E6/uL (ref 4.14–5.80)
RDW: 13.2 % (ref 11.6–15.4)
WBC: 8.3 10*3/uL (ref 3.4–10.8)

## 2019-11-24 LAB — COMPREHENSIVE METABOLIC PANEL
ALT: 26 IU/L (ref 0–44)
AST: 29 IU/L (ref 0–40)
Albumin/Globulin Ratio: 1.7 (ref 1.2–2.2)
Albumin: 4.5 g/dL (ref 3.8–4.8)
Alkaline Phosphatase: 64 IU/L (ref 39–117)
BUN/Creatinine Ratio: 12 (ref 10–24)
BUN: 11 mg/dL (ref 8–27)
Bilirubin Total: 0.3 mg/dL (ref 0.0–1.2)
CO2: 27 mmol/L (ref 20–29)
Calcium: 9.8 mg/dL (ref 8.6–10.2)
Chloride: 101 mmol/L (ref 96–106)
Creatinine, Ser: 0.89 mg/dL (ref 0.76–1.27)
GFR calc Af Amer: 104 mL/min/{1.73_m2} (ref >59)
GFR calc non Af Amer: 90 mL/min/{1.73_m2} (ref >59)
Globulin, Total: 2.6 g/dL (ref 1.5–4.5)
Glucose: 91 mg/dL (ref 65–99)
Potassium: 3.8 mmol/L (ref 3.5–5.2)
Sodium: 141 mmol/L (ref 134–144)
Total Protein: 7.1 g/dL (ref 6.0–8.5)

## 2019-11-24 LAB — MULTIPLE MYELOMA PANEL, SERUM
Albumin SerPl Elph-Mcnc: 3.9 g/dL (ref 2.9–4.4)
Albumin/Glob SerPl: 1.3 (ref 0.7–1.7)
Alpha 1: 0.2 g/dL (ref 0.0–0.4)
Alpha2 Glob SerPl Elph-Mcnc: 0.8 g/dL (ref 0.4–1.0)
B-Globulin SerPl Elph-Mcnc: 1 g/dL (ref 0.7–1.3)
Gamma Glob SerPl Elph-Mcnc: 1.1 g/dL (ref 0.4–1.8)
Globulin, Total: 3.2 g/dL (ref 2.2–3.9)
IgA/Immunoglobulin A, Serum: 202 mg/dL (ref 61–437)
IgG (Immunoglobin G), Serum: 1146 mg/dL (ref 603–1613)
IgM (Immunoglobulin M), Srm: 86 mg/dL (ref 20–172)

## 2019-11-24 LAB — VITAMIN B12: Vitamin B-12: 356 pg/mL (ref 232–1245)

## 2019-11-24 LAB — TSH: TSH: 4.24 u[IU]/mL (ref 0.450–4.500)

## 2019-11-24 LAB — ANA W/REFLEX: ANA Titer 1: NEGATIVE

## 2019-11-24 LAB — SJOGREN'S SYNDROME ANTIBODS(SSA + SSB)
ENA SSA (RO) Ab: <0.2 AI (ref 0.0–0.9)
ENA SSB (LA) Ab: <0.2 AI (ref 0.0–0.9)

## 2019-11-24 LAB — HEMOGLOBIN A1C
Est. average glucose Bld gHb Est-mCnc: 108 mg/dL
Hgb A1c MFr Bld: 5.4 % (ref 4.8–5.6)

## 2019-11-24 LAB — VITAMIN B6: Vitamin B6: 20.6 ug/L (ref 5.3–46.7)

## 2019-11-24 LAB — C-REACTIVE PROTEIN: CRP: 2 mg/L (ref 0–10)

## 2019-11-24 LAB — SEDIMENTATION RATE: Sed Rate: 27 mm/hr (ref 0–30)

## 2019-11-24 LAB — HIV ANTIBODY (ROUTINE TESTING W REFLEX): HIV Screen 4th Generation wRfx: NONREACTIVE

## 2019-11-24 LAB — VITAMIN B1: Thiamine: 132 nmol/L (ref 66.5–200.0)

## 2019-11-24 LAB — COPPER, SERUM: Copper: 90 ug/dL (ref 69–132)

## 2019-11-24 LAB — RPR: RPR Ser Ql: NONREACTIVE

## 2019-11-26 ENCOUNTER — Telehealth: Payer: Self-pay | Admitting: *Deleted

## 2019-11-26 NOTE — Telephone Encounter (Signed)
Spoke with patient and informed him his labs are unremarkable. He said he hasn't gotten a call to schedule MRI brian, MRI cervical spine. I advised I;ll send to MRI coordinator. He asked that his cell# be called. Patient verbalized understanding, appreciation.

## 2019-11-27 NOTE — Telephone Encounter (Signed)
LVM for pt to call back about scheduling mri  BCBS Auth: [id number] (exp. 11/27/19 to 12/26/19)

## 2019-12-03 NOTE — Telephone Encounter (Signed)
Pt called back to schedule MRI

## 2019-12-04 NOTE — Telephone Encounter (Signed)
Patient returned my call he is scheduled at [facility] [place] [facility] for 12/11/19.

## 2019-12-04 NOTE — Telephone Encounter (Signed)
I left a voicemail for the patient to call me back.

## 2019-12-11 ENCOUNTER — Ambulatory Visit: Payer: BC Managed Care – PPO

## 2019-12-11 ENCOUNTER — Other Ambulatory Visit: Payer: Self-pay

## 2019-12-11 DIAGNOSIS — G510 Bell's palsy: Secondary | ICD-10-CM

## 2019-12-11 DIAGNOSIS — R269 Unspecified abnormalities of gait and mobility: Secondary | ICD-10-CM

## 2019-12-11 DIAGNOSIS — G3281 Cerebellar ataxia in diseases classified elsewhere: Secondary | ICD-10-CM | POA: Diagnosis not present

## 2019-12-11 HISTORY — DX: Bell's palsy: G51.0

## 2019-12-11 MED ORDER — GADOBENATE DIMEGLUMINE 529 MG/ML IV SOLN
20.0000 mL | Freq: Once | INTRAVENOUS | Status: AC | PRN
  Administered 2019-12-11: 20 mL via INTRAVENOUS

## 2019-12-24 ENCOUNTER — Telehealth: Payer: Self-pay | Admitting: Adult Health

## 2019-12-24 NOTE — Telephone Encounter (Signed)
Patient called and left a voicemail requesting results of his MRI.  I am working in the phone room today

## 2019-12-24 NOTE — Telephone Encounter (Signed)
Unremarkable imaging. See result note. May consider repeat MRI brain in 6-12 months. -VRP

## 2019-12-25 NOTE — Telephone Encounter (Addendum)
Called patient and informed him his MRI brain showed nremarkable imaging results. Dr Leta Baptist stated he may consider repeat scan in 6-12 months to follow up on a non-specific foci of scar tissue. The MRI cervical spine showed unremarkable results. We scheduled a 6 month FU at his request. Patient verbalized understanding, appreciation.

## 2020-05-27 ENCOUNTER — Ambulatory Visit (INDEPENDENT_AMBULATORY_CARE_PROVIDER_SITE_OTHER): Payer: BC Managed Care – PPO | Admitting: Diagnostic Neuroimaging

## 2020-05-27 ENCOUNTER — Encounter: Payer: Self-pay | Admitting: Diagnostic Neuroimaging

## 2020-05-27 VITALS — BP 162/90 | HR 79 | Ht 72.0 in | Wt 214.8 lb

## 2020-05-27 DIAGNOSIS — G3281 Cerebellar ataxia in diseases classified elsewhere: Secondary | ICD-10-CM | POA: Diagnosis not present

## 2020-05-27 DIAGNOSIS — H814 Vertigo of central origin: Secondary | ICD-10-CM | POA: Diagnosis not present

## 2020-05-27 DIAGNOSIS — R269 Unspecified abnormalities of gait and mobility: Secondary | ICD-10-CM

## 2020-05-27 DIAGNOSIS — M4807 Spinal stenosis, lumbosacral region: Secondary | ICD-10-CM

## 2020-05-27 NOTE — Progress Notes (Signed)
[facility]  PATIENT: Joseph Duffy DOB: 14-Sep-1955  REFERRING CLINICIAN: Glenda Chroman, MD HISTORY FROM: patient  REASON FOR VISIT: follow up   HISTORICAL  CHIEF COMPLAINT:  Chief Complaint  Patient presents with   Gait Problem    rm 7, 6 month FU "no change in my walking; Bell's Palsey June '21- still dealing with it"     HISTORY OF PRESENT ILLNESS:   UPDATE (05/27/20, VRP): Since last visit, more balance / coordination issues. Also had new bells palsy dx on right side in June 2021. Also new urinary urgency. No alleviating or aggravating factors. Tolerating meds.    PRIOR HPI: 64 year old male here for evaluation of gait and balance difficulty.  Symptoms started 6 to 8 months ago.  He describes some difficulty with balance coordination especially with playing golf.  Symptoms walking he has to focus on intentionally taking steps so that he does not fall down.  He feels he is having to relearn how to walk.  He denies any numbness or weakness in his legs.  No problems with his arms.  No neck or low back pain.   REVIEW OF SYSTEMS: Full 14 system review of systems performed and negative with exception of: As per HPI.  ALLERGIES: No Known Allergies  HOME MEDICATIONS: Outpatient Medications Prior to Visit  Medication Sig Dispense Refill   chlorthalidone (HYGROTON) 25 MG tablet Take by mouth. (Patient not taking: Reported on 05/27/2020)     No facility-administered medications prior to visit.    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  FAMILY HISTORY: Family History  Problem Relation Age of Onset   COPD Father     SOCIAL HISTORY: Social History   Socioeconomic History   Marital status: Divorced    Spouse name:  Not on file   Number of children: 2   Years of education: Not on file   Highest education level: Not on file  Occupational History    Comment: retired  Tobacco Use   Smoking status: Current Every Day Smoker    Packs/day: 0.50    Years: 26.00    Pack years: 13.00    Types: Cigarettes   Smokeless tobacco: Never Used  Substance and Sexual Activity   Alcohol use: Yes    Alcohol/week: 0.0 standard drinks    Comment: beer or two on the weekend   Drug use: No    Comment: marijuana   Sexual activity: Not on file  Other Topics Concern   Not on file  Social History Narrative   Lives with mom to care for her   Caffeine- coffee, Mtn Dew occas   Social Determinants of Health   Financial Resource Strain:    Difficulty of Paying Living Expenses: Not on file  Food Insecurity:    Worried About Charity fundraiser in the Last Year: Not on file   [employer] of Food in the Last Year: Not on file  Transportation Needs:    Lack of Transportation (Medical): Not on file   Lack of Transportation (Non-Medical): Not on file  Physical Activity:    Days of Exercise per Week: Not on file   Minutes of Exercise per Session: Not  on file  Stress:    Feeling of Stress : Not on file  Social Connections:    Frequency of Communication with Friends and Family: Not on file   Frequency of Social Gatherings with Friends and Family: Not on file   Attends Religious Services: Not on file   Active Member of Clubs or Organizations: Not on file   Attends Archivist Meetings: Not on file   Marital Status: Not on file  Intimate Partner Violence:    Fear of Current or Ex-Partner: Not on file   Emotionally Abused: Not on file   Physically Abused: Not on file   Sexually Abused: Not on file     PHYSICAL EXAM  GENERAL EXAM/CONSTITUTIONAL: Vitals:  Vitals:   05/27/20 1119  BP: (!) 162/90  Pulse: 79  Weight: 214 lb 12.8 oz (97.4 kg)  Height: 6' (1.829 m)   Body mass index  is 29.13 kg/m. Wt Readings from Last 3 Encounters:  05/27/20 214 lb 12.8 oz (97.4 kg)  11/20/19 216 lb 6.4 oz (98.2 kg)  04/06/16 195 lb (88.5 kg)    Patient is in no distress; well developed, nourished and groomed; neck is supple  CARDIOVASCULAR:  Examination of carotid arteries is normal; no carotid bruits  Regular rate and rhythm, no murmurs  Examination of peripheral vascular system by observation and palpation is normal  EYES:  Ophthalmoscopic exam of optic discs and posterior segments is normal; no papilledema or hemorrhages No exam data present  MUSCULOSKELETAL:  Gait, strength, tone, movements noted in Neurologic exam below  NEUROLOGIC: MENTAL STATUS:  No flowsheet data found.  awake, alert, oriented to person, place and time  recent and remote memory intact  normal attention and concentration  language fluent, comprehension intact, naming intact  fund of knowledge appropriate  CRANIAL NERVE:   2nd - no papilledema on fundoscopic exam  2nd, 3rd, 4th, 6th - pupils equal and reactive to light, visual fields full to confrontation, extraocular muscles intact, no nystagmus  5th - facial sensation symmetric  7th - facial strength --> MILD RIGHT RACE WEAKNESS (UPPER AND LOWER)  8th - hearing intact  9th - palate elevates symmetrically, uvula midline  11th - shoulder shrug symmetric  12th - tongue protrusion midline  MOTOR:   normal bulk, full strength in the BUE, BLE  SENSORY:   normal and symmetric to light touch, pinprick, temperature, vibration; EXCEPT DECR TEMP AND VIB IN HANDS  COORDINATION:   finger-nose-finger, fine finger movements normal  REFLEXES:   deep tendon reflexes --> BUE 1; KNEES 1; ANKLES TRACE WITH REINFORCEMENT  GAIT/STATION:   CAREFUL, WIDE BASED GAIT; DIFF WITH HEEL AND TANDEM GAIT; ROMBERG NEG     DIAGNOSTIC DATA (LABS, IMAGING, TESTING) - I reviewed patient records, labs, notes, testing and imaging myself  where available.  Lab Results  Component Value Date   WBC 8.3 11/20/2019   HGB 15.9 11/20/2019   HCT 46.4 11/20/2019   MCV 94 11/20/2019   PLT 169 11/20/2019      Component Value Date/Time   NA 141 11/20/2019 1540   K 3.8 11/20/2019 1540   CL 101 11/20/2019 1540   CO2 27 11/20/2019 1540   GLUCOSE 91 11/20/2019 1540   GLUCOSE 100 (H) 04/06/2016 1346   BUN 11 11/20/2019 1540   CREATININE 0.89 11/20/2019 1540   CALCIUM 9.8 11/20/2019 1540   PROT 7.1 11/20/2019 1540   ALBUMIN 4.5 11/20/2019 1540   AST 29 11/20/2019 1540   ALT 26  11/20/2019 1540   ALKPHOS 64 11/20/2019 1540   BILITOT 0.3 11/20/2019 1540   GFRNONAA 90 11/20/2019 1540   GFRAA 104 11/20/2019 1540   No results found for: CHOL, HDL, LDLCALC, LDLDIRECT, TRIG, CHOLHDL Lab Results  Component Value Date   HGBA1C 5.4 11/20/2019   Lab Results  Component Value Date   OMAYOKHT97 741 11/20/2019   Lab Results  Component Value Date   TSH 4.240 11/20/2019    11/20/19 Labs testing: ANA, B12, A1C, TSH, SPEP, SSA, SSB, HIV, RPR, B1, B6, COPPER - all normal  12/11/19 MRI brain (with and without) demonstrating: - Scattered periventricular and subcortical foci of T2 hyperintensities.  No abnormal lesions are seen on post contrast views. These findings are non-specific and considerations include autoimmune, inflammatory, post-infectious, microvascular ischemic or migraine associated etiologies. - No acute findings.  12/11/19 MRI cervical spine with and without contrast demonstrating: - At C3-4: disc bulging and facet hypertrophy with mild biforaminal stenosis. - No intrinsic or compressive spinal cord lesions.   ASSESSMENT AND PLAN  64 y.o. year old male here with history of Hep C cirrhosis, porphyria cutanea tarda, new onset gradual and progressive gait and balance difficulty since September 2020, mainly noticed when patient is playing golf or walking.  Dx:  1. Gait difficulty   2. Cerebellar ataxia in diseases  classified elsewhere [facility])       PLAN:  COORDINATION DIFFICULTY / RIGHT BELLS PALSY - repeat MRI brain / IAC - check addl labs (autoimmune, paraneoplastic, metabolic)  URINARY URGENCY / GAIT DIFFICULTY / LEFT LEG PAIN - check MRI lumbar spine (rule out spinal stenosis or cauda equina syndrome) - check EMG/NCS (neuropathy eval)  Orders Placed This Encounter  Procedures   MR Lumbar Spine W Wo Contrast   MR BRAIN/IAC W WO CONTRAST   Anti-Hu, Anti-Ri, Anti-Yo IFA   Vitamin E   Gliadin Antibodies, Serum   Thyroid peroxidase antibody   Glutamic acid decarboxylase auto abs   NCV with EMG(electromyography)   Return for for NCV/EMG.    Penni Bombard, MD 42/39/5320, 23:34 AM Certified in Neurology, Neurophysiology and Neuroimaging  [facility] [address], [place], [place] [address] [phone]

## 2020-05-27 NOTE — Patient Instructions (Signed)
-   check labs, MRI brain, MRI lumbar spine, EMG testing (electrical nerve testing)

## 2020-05-28 ENCOUNTER — Telehealth: Payer: Self-pay | Admitting: Diagnostic Neuroimaging

## 2020-05-28 NOTE — Telephone Encounter (Signed)
LVM for pt to call back about scheduling mri  BCBS auth: [id number] (exp. 05/28/20 to 06/26/20)

## 2020-06-03 ENCOUNTER — Telehealth: Payer: Self-pay | Admitting: *Deleted

## 2020-06-03 LAB — ANTI-HU, ANTI-RI, ANTI-YO IFA
Anti-Hu Ab: NEGATIVE
Anti-Ri Ab: NEGATIVE
Anti-Yo Ab: NEGATIVE

## 2020-06-03 LAB — GLUTAMIC ACID DECARBOXYLASE AUTO ABS: Glutamic Acid Decarb Ab: <5 U/mL (ref 0.0–5.0)

## 2020-06-03 LAB — VITAMIN E
Vitamin E (Alpha Tocopherol): 9.7 mg/L (ref 9.0–29.0)
Vitamin E(Gamma Tocopherol): 1.2 mg/L (ref 0.5–4.9)

## 2020-06-03 LAB — GLIADIN ANTIBODIES, SERUM
Antigliadin Abs, IgA: 2 units (ref 0–19)
Gliadin IgG: 1 units (ref 0–19)

## 2020-06-03 LAB — THYROID PEROXIDASE ANTIBODY: Thyroperoxidase Ab SerPl-aCnc: 11 IU/mL (ref 0–34)

## 2020-06-03 NOTE — Telephone Encounter (Signed)
LVM informing normal labs, will wait for NCS results. Left # for questions.

## 2020-06-19 ENCOUNTER — Ambulatory Visit (INDEPENDENT_AMBULATORY_CARE_PROVIDER_SITE_OTHER): Payer: BC Managed Care – PPO | Admitting: Diagnostic Neuroimaging

## 2020-06-19 ENCOUNTER — Encounter (INDEPENDENT_AMBULATORY_CARE_PROVIDER_SITE_OTHER): Payer: BC Managed Care – PPO | Admitting: Diagnostic Neuroimaging

## 2020-06-19 ENCOUNTER — Other Ambulatory Visit: Payer: Self-pay

## 2020-06-19 DIAGNOSIS — Z0289 Encounter for other administrative examinations: Secondary | ICD-10-CM

## 2020-06-19 DIAGNOSIS — G3281 Cerebellar ataxia in diseases classified elsewhere: Secondary | ICD-10-CM

## 2020-06-19 DIAGNOSIS — R269 Unspecified abnormalities of gait and mobility: Secondary | ICD-10-CM

## 2020-06-19 DIAGNOSIS — M4807 Spinal stenosis, lumbosacral region: Secondary | ICD-10-CM | POA: Diagnosis not present

## 2020-06-19 NOTE — Procedures (Signed)
[facility]  NCS (NERVE CONDUCTION STUDY) WITH EMG (ELECTROMYOGRAPHY) REPORT   STUDY DATE: 06/19/20 PATIENT NAME: Joseph Duffy DOB: [birth year] MRN: [id number]  ORDERING CLINICIAN: Andrey Spearman, MD   TECHNOLOGIST: Sherre Scarlet ELECTROMYOGRAPHER: Earlean Polka. Daija Routson, MD  CLINICAL INFORMATION: 64 year old male here for evaluation of gait difficulty and incontinence.  FINDINGS: NERVE CONDUCTION STUDY:  Bilateral peroneal and tibial motor responses are normal.  Bilateral sural and superficial peroneal sensory responses are normal.  Bilateral tibial F wave latencies are slightly prolonged.   NEEDLE ELECTROMYOGRAPHY:  Needle examination of left lower extremity and left lumbar paraspinal muscles are normal.   IMPRESSION:   Overall normal study except for slightly prolonged tibial F-wave latencies, could be related to lumbar radiculopathies or increased limb length.    INTERPRETING PHYSICIAN:  Penni Bombard, MD Certified in Neurology, Neurophysiology and Neuroimaging  [facility] [address] [place], [place] [address] [phone]   [facility]    Nerve / Sites Muscle Latency Ref. Amplitude Ref. Rel Amp Segments Distance Velocity Ref. Area    ms ms mV mV %  cm m/s m/s mVms  L Peroneal - EDB     Ankle EDB 4.9 ?6.5 5.4 ?2.0 100 Ankle - EDB 9   19.6     Fib head EDB 12.0  5.1  93.9 Fib head - Ankle 31 44 ?44 18.9     Pop fossa EDB 14.3  4.9  97.3 Pop fossa - Fib head 10 44 ?44 18.5         Pop fossa - Ankle      R Peroneal - EDB     Ankle EDB 4.5 ?6.5 6.1 ?2.0 100 Ankle - EDB 9   22.1     Fib head EDB 11.5  5.1  83.3 Fib head - Ankle 31 44 ?44 19.3     Pop fossa EDB 13.8  4.1  80.8 Pop fossa - Fib head 10 44 ?44 17.3         Pop fossa - Ankle      L Tibial - AH     Ankle AH 4.9 ?5.8 6.5 ?4.0 100 Ankle - AH 9   12.8     Pop fossa AH 15.0  2.9  43.8 Pop fossa - Ankle 41 41 ?41 9.5  R Tibial - AH     Ankle AH 4.8  ?5.8 4.7 ?4.0 100 Ankle - AH 9   9.8     Pop fossa AH 15.3  3.1  67.1 Pop fossa - Ankle 43 41 ?41 10.4             SNC    Nerve / Sites Rec. Site Peak Lat Ref.  Amp Ref. Segments Distance    ms ms V V  cm  L Sural - Ankle (Calf)     Calf Ankle 4.1 ?4.4 6 ?6 Calf - Ankle 14  R Sural - Ankle (Calf)     Calf Ankle 4.0 ?4.4 7 ?6 Calf - Ankle 14  L Superficial peroneal - Ankle     Lat leg Ankle 4.1 ?4.4 7 ?6 Lat leg - Ankle 14  R Superficial peroneal - Ankle     Lat leg Ankle 4.1 ?4.4 7 ?6 Lat leg - Ankle 14             F  Wave    Nerve F Lat Ref.   ms ms  L Tibial - AH 60.1 ?56.0  R  Tibial - AH 60.3 ?56.0         EMG Summary Table    Spontaneous MUAP Recruitment  Muscle IA Fib PSW Fasc Other Amp Dur. Poly Pattern  L. Vastus medialis Normal None None None _______ Normal Normal Normal Normal  L. Tibialis anterior Normal None None None _______ Normal Normal Normal Normal  L. Gastrocnemius (Medial head) Normal None None None _______ Normal Normal Normal Normal  L. Lumbar paraspinals Normal None None None _______ Normal Normal Normal Normal

## 2020-07-18 ENCOUNTER — Ambulatory Visit: Payer: 59 | Admitting: Urology

## 2020-07-30 ENCOUNTER — Ambulatory Visit (INDEPENDENT_AMBULATORY_CARE_PROVIDER_SITE_OTHER): Payer: 59 | Admitting: Urology

## 2020-07-30 ENCOUNTER — Encounter: Payer: Self-pay | Admitting: Urology

## 2020-07-30 ENCOUNTER — Ambulatory Visit: Payer: 59 | Admitting: Urology

## 2020-07-30 ENCOUNTER — Other Ambulatory Visit: Payer: Self-pay

## 2020-07-30 VITALS — BP 157/76 | HR 70 | Temp 98.3°F | Ht 72.0 in | Wt 210.0 lb

## 2020-07-30 DIAGNOSIS — N401 Enlarged prostate with lower urinary tract symptoms: Secondary | ICD-10-CM

## 2020-07-30 DIAGNOSIS — R339 Retention of urine, unspecified: Secondary | ICD-10-CM | POA: Diagnosis not present

## 2020-07-30 DIAGNOSIS — N138 Other obstructive and reflux uropathy: Secondary | ICD-10-CM | POA: Diagnosis not present

## 2020-07-30 DIAGNOSIS — N3281 Overactive bladder: Secondary | ICD-10-CM

## 2020-07-30 DIAGNOSIS — R32 Unspecified urinary incontinence: Secondary | ICD-10-CM

## 2020-07-30 LAB — URINALYSIS, ROUTINE W REFLEX MICROSCOPIC
Bilirubin, UA: NEGATIVE
Glucose, UA: NEGATIVE
Ketones, UA: NEGATIVE
Leukocytes,UA: NEGATIVE
Nitrite, UA: NEGATIVE
Protein,UA: NEGATIVE
RBC, UA: NEGATIVE
Specific Gravity, UA: 1.02 (ref 1.005–1.030)
Urobilinogen, Ur: 0.2 mg/dL (ref 0.2–1.0)
pH, UA: 7 (ref 5.0–7.5)

## 2020-07-30 LAB — BLADDER SCAN AMB NON-IMAGING: Scan Result: 309

## 2020-07-30 MED ORDER — ALFUZOSIN HCL ER 10 MG PO TB24: 10.0000 mg | ORAL_TABLET | Freq: Every day | ORAL | 11 refills | Status: DC

## 2020-07-30 NOTE — Progress Notes (Signed)
07/30/2020 2:12 PM   Tawny Asal 06/08/1956 [id number]  Referring provider: Glenda Chroman, MD [place],  [place] [address]  Urinary incontinence  HPI: Joseph Duffy is a 65yo here for evaluation of urinary incontinence. For the past 6-8 weeks he has noted worsening urgency and urge incontinence. He was started on mirabegron which improved his incontinence but he is off this medication currently. He is currently on oxybutynin He has a 1 year history of a weak urinary stream, urinary hesitancy, starting/stopping, post void dribbling and feeling of incomplete emptying. PVR 309cc. He is unhappy with his urination   PMH: Past Medical History:  Diagnosis Date  . Ataxia   . Bell's palsy 12/2019   right side  . Cirrhosis of liver ([place])   . Hepatitis C   . Hypertension   . 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 No pertinent surgical history.  Home Medications:  Allergies as of 07/30/2020   No Known Allergies     Medication List       Accurate as of July 30, 2020  2:12 PM. If you have any questions, ask your nurse or doctor.        chlorthalidone 25 MG tablet Commonly known as: HYGROTON Take by mouth.   oxybutynin 5 MG 24 hr tablet Commonly known as: DITROPAN-XL Take 5 mg by mouth daily.       Allergies: No Known Allergies  Family History: Family History  Problem Relation Age of Onset  . COPD Father     Social History:  reports that he has been smoking cigarettes. He has a 13.00 pack-year smoking history. He has never used smokeless tobacco. He reports current alcohol use. He reports that he does not use drugs.  ROS: All other review of systems were reviewed and are negative except what is noted above in HPI  Physical Exam: BP (!) 157/76   Pulse 70   Temp 98.3 F (36.8 C)   Ht 6' (1.829 m)   Wt 210 lb (95.3 kg)   BMI 28.48 kg/m   Constitutional:  Alert and oriented, No acute distress. HEENT: [place] AT, moist mucus  membranes.  Trachea midline, no masses. Cardiovascular: No clubbing, cyanosis, or edema. Respiratory: Normal respiratory effort, no increased work of breathing. GI: Abdomen is soft, nontender, nondistended, no abdominal masses GU: No CVA tenderness. Circumcised phallus. No masses/lesions on penis, testis, scrotum. Prostate 40g smooth no nodules no induration.  Lymph: No cervical or inguinal lymphadenopathy. Skin: No rashes, bruises or suspicious lesions. Neurologic: Grossly intact, no focal deficits, moving all 4 extremities. Psychiatric: Normal mood and affect.  Laboratory Data: Lab Results  Component Value Date   WBC 8.3 11/20/2019   HGB 15.9 11/20/2019   HCT 46.4 11/20/2019   MCV 94 11/20/2019   PLT 169 11/20/2019    Lab Results  Component Value Date   CREATININE 0.89 11/20/2019    No results found for: PSA  No results found for: TESTOSTERONE  Lab Results  Component Value Date   HGBA1C 5.4 11/20/2019    Urinalysis    Component Value Date/Time   COLORURINE YELLOW 04/06/2016 1336   APPEARANCEUR Clear 07/30/2020 1351   LABSPEC 1.025 04/06/2016 1336   PHURINE 5.5 04/06/2016 1336   GLUCOSEU Negative 07/30/2020 1351   HGBUR NEGATIVE 04/06/2016 1336   BILIRUBINUR Negative 07/30/2020 1351   KETONESUR NEGATIVE 04/06/2016 1336   PROTEINUR Negative 07/30/2020 1351   PROTEINUR NEGATIVE 04/06/2016 1336  NITRITE Negative 07/30/2020 1351   NITRITE NEGATIVE 04/06/2016 1336   LEUKOCYTESUR Negative 07/30/2020 1351    Lab Results  Component Value Date   LABMICR Comment 07/30/2020    Pertinent Imaging:  No results found for this or any previous visit.  No results found for this or any previous visit.  No results found for this or any previous visit.  No results found for this or any previous visit.  No results found for this or any previous visit.  No results found for this or any previous visit.  No results found for this or any previous visit.  No results  found for this or any previous visit.   Assessment & Plan:    1. OAB (overactive bladder) -Stop oxybutynin - Urinalysis, Routine w reflex microscopic - BLADDER SCAN AMB NON-IMAGING  2. Benign prostatic hyperplasia with urinary obstruction -We will start uroxatral 10mg   3. Incomplete emptying of bladder -Uroxatral 10mg  qhs. RTC 4 weeks with PVR   No follow-ups on file.  Nicolette Bang, MD  [facility] [place]

## 2020-07-30 NOTE — Progress Notes (Signed)
Bladder Scan Patient can void: 309 ml Performed By: Marleen Moret,lpn     Urological Symptom Review  Patient is experiencing the following symptoms: Frequent urination Get up at night to urinate Leakage of urine Trouble starting stream Weak stream Erection problems (male only)   Review of Systems  Gastrointestinal (upper)  : Negative for upper GI symptoms  Gastrointestinal (lower) : Negative for lower GI symptoms  Constitutional : Negative for symptoms  Skin: Negative for skin symptoms  Eyes: Negative for eye symptoms  Ear/Nose/Throat : Negative for Ear/Nose/Throat symptoms  Hematologic/Lymphatic: Negative for Hematologic/Lymphatic symptoms  Cardiovascular : Negative for cardiovascular symptoms  Respiratory : Negative for respiratory symptoms  Endocrine: Negative for endocrine symptoms  Musculoskeletal: Negative for musculoskeletal symptoms  Neurological: Dizziness  Psychologic: Negative for psychiatric symptoms

## 2020-07-30 NOTE — Patient Instructions (Signed)

## 2020-07-30 NOTE — Addendum Note (Signed)
Addended by: Valentina Lucks on: 07/30/2020 02:25 PM   Modules accepted: Orders

## 2020-09-02 DIAGNOSIS — K7401 Hepatic fibrosis, early fibrosis: Secondary | ICD-10-CM | POA: Diagnosis not present

## 2020-09-02 DIAGNOSIS — Z1211 Encounter for screening for malignant neoplasm of colon: Secondary | ICD-10-CM | POA: Diagnosis not present

## 2020-09-18 ENCOUNTER — Encounter: Payer: Self-pay | Admitting: Rehabilitation

## 2020-09-18 ENCOUNTER — Other Ambulatory Visit: Payer: Self-pay

## 2020-09-18 ENCOUNTER — Ambulatory Visit: Payer: Medicare Other | Attending: Psychiatry | Admitting: Rehabilitation

## 2020-09-18 DIAGNOSIS — R278 Other lack of coordination: Secondary | ICD-10-CM | POA: Diagnosis not present

## 2020-09-18 DIAGNOSIS — R2689 Other abnormalities of gait and mobility: Secondary | ICD-10-CM | POA: Diagnosis not present

## 2020-09-18 DIAGNOSIS — R2681 Unsteadiness on feet: Secondary | ICD-10-CM | POA: Insufficient documentation

## 2020-09-18 DIAGNOSIS — R42 Dizziness and giddiness: Secondary | ICD-10-CM | POA: Insufficient documentation

## 2020-09-18 NOTE — Therapy (Signed)
[facility] [address] [place], [place], [address] Phone: [phone]   Fax:  [phone]  Physical Therapy Evaluation  Patient Details  Name: TAREN TOOPS MRN: [id number] Date of Birth: 02-18-1956 Referring Provider (PT): William Dalton, MD   Encounter Date: 09/18/2020   PT End of Session - 09/18/20 1215    Visit Number 1    Number of Visits 17    Date for PT Re-Evaluation 11/17/20    Authorization Type [employer] Medicare (10th visit PN needed)    Progress Note Due on Visit 10    PT Start Time 1018    PT Stop Time 1103    PT Time Calculation (min) 45 min    Activity Tolerance Patient tolerated treatment well    Behavior During Therapy [facility] for tasks assessed/performed           Past Medical History:  Diagnosis Date  . Ataxia   . Bell's palsy 12/2019   right side  . Cirrhosis of liver ([place])   . Hepatitis C   . Hypertension   . 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[place])     History reviewed. No pertinent surgical history.  There were no vitals filed for this visit.    Subjective Assessment - 09/18/20 1022    Subjective "In the last week I was over at [facility] and diagnosed with cerebellar ataxia.  I notice that its worse on my right side, but it may be that I'm right handed.  I noticed this about a year ago when I was playing golf and I was more off balance."    Pertinent History Bells Palsy, HepC, HTN, sunlight sensitivity    Limitations Walking    How long can you walk comfortably? approx 30-45 mins    Patient Stated Goals "I want to stop this swaying."    Currently in Pain? No/denies              [facility] PT Assessment - 09/18/20 1033      Assessment   Medical Diagnosis Cerebellar Ataxia    Referring Provider (PT) William Dalton, MD    Onset Date/Surgical Date --   Reports noticing changes over the last year   Prior Therapy None      Precautions   Precautions Fall      Balance Screen   Has  the patient fallen in the past 6 months No    Has the patient had a decrease in activity level because of a fear of falling?  No    Is the patient reluctant to leave their home because of a fear of falling?  No      Home Environment   Living Environment Private residence    Living Arrangements Alone    Available Help at Discharge Family;Available PRN/intermittently    Type of [place] entrance    Home Layout One level    Home Equipment None      Prior Function   Level of Independence Independent    Vocation Retired    Leisure Likes to play golf      Cognition   Overall Cognitive Status Within Functional Limits for tasks assessed      Sensation   Light Touch Appears Intact      Coordination   Gross Motor Movements are Fluid and Coordinated No    Fine Motor Movements are Fluid and Coordinated No    Coordination and Movement Description Slightly dysmetric  initially with testing, but once to target (esp in leg) does well.    Heel Shin Test Slight delay in finding leg but then [facility]      ROM / Strength   AROM / PROM / Strength Strength      Strength   Overall Strength Deficits    Overall Strength Comments Overall 4/5 to 5/5, except R ankle DF 3+/5      Transfers   Transfers Sit to Stand;Stand to Sit    Sit to Stand 6: Modified independent (Device/Increase time)    Five time sit to stand comments  11.18 secs without UE support from standard arm chair.  Note some forceful extension once in standing.    Stand to Sit 6: Modified independent (Device/Increase time)      Ambulation/Gait   Ambulation/Gait Yes    Ambulation/Gait Assistance 5: Supervision;4: Min guard    Ambulation/Gait Assistance Details S into and out of clinic without device.  With balance challenges and with fatigue more min/guard for safety.  With fatigue note wider BOS and more uncoordinated movements in RLE, esp more R foot slap (decreased eccentric DF control).    Ambulation Distance  (Feet) 150 Feet    Assistive device None    Gait Pattern Step-through pattern;Decreased dorsiflexion - right;Right foot flat;Ataxic;Wide base of support    Ambulation Surface Level;Indoor    Gait velocity 4.58 ft/sec without device, somewhat wider BOS.    Stairs Yes    Stairs Assistance 5: Supervision    Stair Management Technique One rail Right    Number of Stairs 4    Height of Stairs 6      Functional Gait  Assessment   Gait assessed  Yes    Gait Level Surface Walks 20 ft, slow speed, abnormal gait pattern, evidence for imbalance or deviates 10-15 in outside of the 12 in walkway width. Requires more than 7 sec to ambulate 20 ft.   8.27 secs with wider BOS and more incoordination   Change in Gait Speed Able to smoothly change walking speed without loss of balance or gait deviation. Deviate no more than 6 in outside of the 12 in walkway width.    Gait with Horizontal Head Turns Performs head turns smoothly with slight change in gait velocity (eg, minor disruption to smooth gait path), deviates 6-10 in outside 12 in walkway width, or uses an assistive device.    Gait with Vertical Head Turns Performs task with slight change in gait velocity (eg, minor disruption to smooth gait path), deviates 6 - 10 in outside 12 in walkway width or uses assistive device    Gait and Pivot Turn Pivot turns safely in greater than 3 sec and stops with no loss of balance, or pivot turns safely within 3 sec and stops with mild imbalance, requires small steps to catch balance.    Step Over Obstacle Is able to step over 2 stacked shoe boxes taped together (9 in total height) without changing gait speed. No evidence of imbalance.    Gait with Narrow Base of Support Ambulates less than 4 steps heel to toe or cannot perform without assistance.    Gait with Eyes Closed Walks 20 ft, uses assistive device, slower speed, mild gait deviations, deviates 6-10 in outside 12 in walkway width. Ambulates 20 ft in less than 9 sec but  greater than 7 sec.    Ambulating Backwards Walks 20 ft, uses assistive device, slower speed, mild gait deviations, deviates 6-10 in outside 12  in walkway width.    Steps Alternating feet, must use rail.    Total Score 19    FGA comment: 19-24 = medium risk fall                      Objective measurements completed on examination: See above findings.               PT Education - 09/18/20 1214    Education Details Educated on POC, goals, typical outcomes, evaluation findings    Person(s) Educated Patient    Methods Explanation    Comprehension Verbalized understanding            PT Short Term Goals - 09/18/20 1235      PT SHORT TERM GOAL #1   Title Pt will be IND with initial HEP in order to indicate dec fall risk and improved functional mobility. (Target Date: 10/18/20)    Time 4    Period Weeks    Status New    Target Date 10/18/20      PT SHORT TERM GOAL #2   Title Pt will demonstrate improved control when performing 5TSS test and perform in </=11.5 secs to indicate improved control/strength.    Time 4    Period Weeks    Status New      PT SHORT TERM GOAL #3   Title Pt will improve FGA to >/=23/30 in order to indicate dec fall risk.    Time 4    Period Weeks    Status New      PT SHORT TERM GOAL #4   Title Pt will perform putting in controlled environment at S level in order to indicate safety when golfing.    Time 4    Period Weeks    Status New      PT SHORT TERM GOAL #5   Title Pt will ambulate x 300' over varying outdoor surfaces with more normal BOS and more controlled eccentric DF to indicate safety with leisure activities.    Time 4    Period Weeks    Status New             PT Long Term Goals - 09/18/20 1240      PT LONG TERM GOAL #1   Title Pt will be IND with final HEP in order to indicate dec fall risk and improved functional mobility. (Target Date: 11/17/20)    Time 8    Period Weeks    Status New    Target Date  11/17/20      PT LONG TERM GOAL #2   Title Pt will improve FGA to >/=26/30 in order to indicate dec fall risk.    Time 8    Period Weeks    Status New      PT LONG TERM GOAL #3   Title Pt will perform golf swings outside of varied surfaces at S level (distant) with no overt LOB and good control during "set up" in order to indicate safety with golf game.    Time 8    Period Weeks    Status New      PT LONG TERM GOAL #4   Title Pt will ambulate >1000' over varying surfaces at mod I level without overt LOB/gait compensations in order to indicate safety with leisure activities.    Time 8    Period Weeks    Status New  Plan - 09/18/20 1216    Clinical Impression Statement Pt is a pleasant 65 y/o male with recent diagnosis of cerebellar ataxia with reports of decreased balance over the last year and decreased coordination in RUE/LE more recently.  Note history of cirrhosis, HTN, Bells Palsy.  He notes more and more difficulty with golf which he greatly enjoys now that he is retired.  Upon PT evaluation, note 5TSS time of 11.18secs without UE support, however has forceful extension at end of range, gait speed of 4.58 ft/sec with wide BOS, R foot slap and dec coordination.  FGA score of 19/30 indicative of medium fall risk.  PT discussed sleep habits with pt as he is not always sleeping well due to "thinking about this condition" which impacts his next day and how well he functions the next day.  Discussed caution with "researching" his diagnosis as this seems to be an area that stresses him out.  Pt verbalized understanding.  Pt will benefit from skilled OP neuro to address deficits.    Personal Factors and Comorbidities Comorbidity 3+;Time since onset of injury/illness/exacerbation    Comorbidities see above    Examination-Activity Limitations Lift;Squat;Locomotion Level;Stairs;Stand    Examination-Participation Restrictions Community Activity;Yard Work;Shop     Stability/Clinical Decision Making Evolving/Moderate complexity    Clinical Decision Making Moderate    Rehab Potential Good    PT Frequency 2x / week    PT Duration 8 weeks    PT Treatment/Interventions ADLs/Self Care Home Management;Aquatic Therapy;DME Instruction;Gait training;Stair training;Functional mobility training;Therapeutic activities;Therapeutic exercise;Balance training;Neuromuscular re-education;Patient/family education;Visual/perceptual remediation/compensation;Vestibular    PT Next Visit Plan Initiate high level balance HEP, work on slow controlled movements, wants to work on golf!!, may need to do a vestibular exam (some comments he made during session made me think vestibular issues).    Consulted and Agree with Plan of Care Patient           Patient will benefit from skilled therapeutic intervention in order to improve the following deficits and impairments:  Abnormal gait,Decreased activity tolerance,Decreased balance,Decreased coordination,Decreased endurance,Decreased mobility,Impaired UE functional use,Postural dysfunction  Visit Diagnosis: Unsteadiness on feet  Other lack of coordination  Other abnormalities of gait and mobility  Dizziness and giddiness     Problem List Patient Active Problem List   Diagnosis Date Noted  . Benign prostatic hyperplasia with urinary obstruction 07/30/2020  . Incomplete emptying of bladder 07/30/2020  . Essential hypertension 11/08/2014  . Hepatitis C 11/08/2014    Cameron Sprang, PT, MPT [facility] [address] [place], [place], [address] Phone: [phone]   Fax:  [phone] 09/18/20, 12:48 PM  Name: JODEY BURBANO MRN: [id number] Date of Birth: [birth year]

## 2020-09-19 ENCOUNTER — Other Ambulatory Visit: Payer: Self-pay

## 2020-09-19 ENCOUNTER — Encounter: Payer: Self-pay | Admitting: Urology

## 2020-09-19 ENCOUNTER — Ambulatory Visit (INDEPENDENT_AMBULATORY_CARE_PROVIDER_SITE_OTHER): Payer: Medicare Other | Admitting: Urology

## 2020-09-19 VITALS — BP 154/76 | HR 80 | Temp 97.6°F

## 2020-09-19 DIAGNOSIS — N138 Other obstructive and reflux uropathy: Secondary | ICD-10-CM

## 2020-09-19 DIAGNOSIS — N3281 Overactive bladder: Secondary | ICD-10-CM | POA: Diagnosis not present

## 2020-09-19 DIAGNOSIS — R339 Retention of urine, unspecified: Secondary | ICD-10-CM | POA: Diagnosis not present

## 2020-09-19 DIAGNOSIS — N401 Enlarged prostate with lower urinary tract symptoms: Secondary | ICD-10-CM

## 2020-09-19 LAB — URINALYSIS, ROUTINE W REFLEX MICROSCOPIC
Bilirubin, UA: NEGATIVE
Glucose, UA: NEGATIVE
Ketones, UA: NEGATIVE
Leukocytes,UA: NEGATIVE
Nitrite, UA: NEGATIVE
Protein,UA: NEGATIVE
RBC, UA: NEGATIVE
Specific Gravity, UA: 1.02 (ref 1.005–1.030)
Urobilinogen, Ur: 1 mg/dL (ref 0.2–1.0)
pH, UA: 6.5 (ref 5.0–7.5)

## 2020-09-19 LAB — BLADDER SCAN AMB NON-IMAGING: Scan Result: 139

## 2020-09-19 MED ORDER — SILODOSIN 8 MG PO CAPS: 8.0000 mg | ORAL_CAPSULE | Freq: Every day | ORAL | 11 refills | Status: DC

## 2020-09-19 NOTE — Progress Notes (Signed)
09/19/2020 9:23 AM   Joseph Duffy 1956/06/25 [id number]  Referring provider: Glenda Chroman, MD [place],  [place] [address]  Followup BPH  HPI: Joseph Duffy is a 65yo here for followup for OAB and BPH. He was started on uroxatral 10mg  last visit. The urinary urgency is better. His stream remains weak, hesitancy present, starting/stopping. He has dribbling. He has nocturia 3x and occasional nocturnal enuresis. He has urge incontinence 3-4x per week. PVR 139.  IPSS 23 and QOL 5   PMH: Past Medical History:  Diagnosis Date  . Ataxia   . Bell's palsy 12/2019   right side  . Cirrhosis of liver ([place])   . Hepatitis C   . Hypertension   . 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  Home Medications:  Allergies as of 09/19/2020   No Known Allergies     Medication List       Accurate as of September 19, 2020  9:23 AM. If you have any questions, ask your nurse or doctor.        STOP taking these medications   chlorthalidone 25 MG tablet Commonly known as: HYGROTON Stopped by: Nicolette Bang, MD   oxybutynin 5 MG 24 hr tablet Commonly known as: DITROPAN-XL Stopped by: Nicolette Bang, MD     TAKE these medications   alfuzosin 10 MG 24 hr tablet Commonly known as: UROXATRAL Take 1 tablet (10 mg total) by mouth at bedtime.       Allergies: No Known Allergies  Family History: Family History  Problem Relation Age of Onset  . COPD Father     Social History:  reports that he has been smoking cigarettes. He has a 13.00 pack-year smoking history. He has never used smokeless tobacco. He reports current alcohol use. He reports that he does not use drugs.  ROS: All other review of systems were reviewed and are negative except what is noted above in HPI  Physical Exam: BP (!) 154/76   Pulse 80   Temp 97.6 F (36.4 C)   Constitutional:  Alert and oriented, No acute distress. HEENT: [place] AT, moist mucus membranes.   Trachea midline, no masses. Cardiovascular: No clubbing, cyanosis, or edema. Respiratory: Normal respiratory effort, no increased work of breathing. GI: Abdomen is soft, nontender, nondistended, no abdominal masses GU: No CVA tenderness.  Lymph: No cervical or inguinal lymphadenopathy. Skin: No rashes, bruises or suspicious lesions. Neurologic: Grossly intact, no focal deficits, moving all 4 extremities. Psychiatric: Normal mood and affect.  Laboratory Data: Lab Results  Component Value Date   WBC 8.3 11/20/2019   HGB 15.9 11/20/2019   HCT 46.4 11/20/2019   MCV 94 11/20/2019   PLT 169 11/20/2019    Lab Results  Component Value Date   CREATININE 0.89 11/20/2019    No results found for: PSA  No results found for: TESTOSTERONE  Lab Results  Component Value Date   HGBA1C 5.4 11/20/2019    Urinalysis    Component Value Date/Time   COLORURINE YELLOW 04/06/2016 1336   APPEARANCEUR Clear 07/30/2020 1351   LABSPEC 1.025 04/06/2016 1336   PHURINE 5.5 04/06/2016 1336   GLUCOSEU Negative 07/30/2020 1351   HGBUR NEGATIVE 04/06/2016 1336   BILIRUBINUR Negative 07/30/2020 1351   KETONESUR NEGATIVE 04/06/2016 1336   PROTEINUR Negative 07/30/2020 1351   PROTEINUR NEGATIVE 04/06/2016 1336   NITRITE Negative 07/30/2020 1351   NITRITE NEGATIVE 04/06/2016 1336   LEUKOCYTESUR Negative  07/30/2020 1351    Lab Results  Component Value Date   LABMICR Comment 07/30/2020    Pertinent Imaging:  No results found for this or any previous visit.  No results found for this or any previous visit.  No results found for this or any previous visit.  No results found for this or any previous visit.  No results found for this or any previous visit.  No results found for this or any previous visit.  No results found for this or any previous visit.  No results found for this or any previous visit.   Assessment & Plan:    1. OAB (overactive bladder) -continue timed voiding -  BLADDER SCAN AMB NON-IMAGING - Urinalysis, Routine w reflex microscopic  2. Benign prostatic hyperplasia with urinary obstruction -start rapaflo 8mg  daily  3. Incomplete emptying of bladder -start rapaflo 8mg  daily. RTC 1 month for possible cystoscopy   No follow-ups on file.  Nicolette Bang, MD  [facility] [place]

## 2020-09-19 NOTE — Patient Instructions (Signed)

## 2020-09-19 NOTE — Progress Notes (Signed)
Urological Symptom Review  Patient is experiencing the following symptoms: none   Review of Systems  Gastrointestinal (upper)  : Negative for upper GI symptoms  Gastrointestinal (lower) : Negative for lower GI symptoms  Constitutional : Negative for symptoms  Skin: Negative for skin symptoms  Eyes: Negative for eye symptoms  Ear/Nose/Throat : Negative for Ear/Nose/Throat symptoms  Hematologic/Lymphatic: Negative for Hematologic/Lymphatic symptoms  Cardiovascular : Negative for cardiovascular symptoms  Respiratory : Negative for respiratory symptoms  Endocrine: Negative for endocrine symptoms  Musculoskeletal: Joint pain  Neurological: Negative for neurological symptoms  Psychologic: Negative for psychiatric symptoms

## 2020-09-23 ENCOUNTER — Ambulatory Visit: Payer: Medicare Other

## 2020-09-23 ENCOUNTER — Other Ambulatory Visit: Payer: Self-pay

## 2020-09-23 DIAGNOSIS — R278 Other lack of coordination: Secondary | ICD-10-CM | POA: Diagnosis not present

## 2020-09-23 DIAGNOSIS — R2689 Other abnormalities of gait and mobility: Secondary | ICD-10-CM

## 2020-09-23 DIAGNOSIS — R42 Dizziness and giddiness: Secondary | ICD-10-CM | POA: Diagnosis not present

## 2020-09-23 DIAGNOSIS — R2681 Unsteadiness on feet: Secondary | ICD-10-CM

## 2020-09-23 NOTE — Patient Instructions (Signed)
Access Code: [email or web address] URL: [email or web address] Date: 09/23/2020 Prepared by: Cherly Anderson  Exercises Walking March - 2 x daily - 7 x weekly - 1 sets - 6-8 reps Tandem Walking with Counter Support - 2 x daily - 7 x weekly - 1 sets - 6-8 reps Alternating tapping cup at counter - 2 x daily - 7 x weekly - 2 sets - 10 reps

## 2020-09-23 NOTE — Therapy (Signed)
[facility] [address] [place], [place], [address] Phone: [phone]   Fax:  [phone]  Physical Therapy Treatment  Patient Details  Name: Joseph Duffy MRN: [id number] Date of Birth: June 05, 1956 Referring Provider (PT): William Dalton, MD   Encounter Date: 09/23/2020   PT End of Session - 09/23/20 0935    Visit Number 2    Number of Visits 17    Date for PT Re-Evaluation 11/17/20    Authorization Type U[employer] Medicare (10th visit PN needed)    Progress Note Due on Visit 10    PT Start Time 0933    PT Stop Time 1015    PT Time Calculation (min) 42 min    Activity Tolerance Patient tolerated treatment well    Behavior During Therapy [facility] for tasks assessed/performed           Past Medical History:  Diagnosis Date  . Ataxia   . Bell's palsy 12/2019   right side  . Cirrhosis of liver ([place])   . Hepatitis C   . Hypertension   . 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 No pertinent surgical history.  There were no vitals filed for this visit.   Subjective Assessment - 09/23/20 0935    Subjective Pt reports no real changes other than he does get mild headaches at times but nothing that a little tylenol doesn't fix.    Pertinent History Bells Palsy, HepC, HTN, sunlight sensitivity    Limitations Walking    How long can you walk comfortably? approx 30-45 mins    Patient Stated Goals "I want to stop this swaying."    Currently in Pain? No/denies                   Vestibular Assessment - 09/23/20 0941      Oculomotor Exam   Oculomotor Alignment Normal    Smooth Pursuits Intact    Saccades Overshoots   going to the right and back   Comment Pt does have some right eye droop from his Bells Palsy      Oculomotor Exam-Fixation Suppressed    Left Head Impulse negative    Right Head Impulse negative      Visual Acuity   Static line 10    Dynamic line 9                     [facility] Adult PT Treatment/Exercise - 09/23/20 0936      Ambulation/Gait   Ambulation/Gait Yes    Ambulation/Gait Assistance 5: Supervision    Ambulation/Gait Assistance Details around in clinic during between activities.    Assistive device None    Gait Pattern Step-through pattern;Ataxic;Wide base of support    Ambulation Surface Level;Indoor      Neuro Re-ed    Neuro Re-ed Details  At counter: marching gait with focus on controlled steps 8' x 8, tandem gait with 1 UE fingertip support 8' x 8 CGA. Tapping 3 different colored cones on command x 2 min each leg with verbal cues to tighten core and squeeze bottom. Alternating taps on cone x 10. Standing on rockerboard trying to maintain level x 30 sec CGA. Pt with increased ataxia noted on rockerboard. Performed alternating shoulder flexion x 10 with CGA/min assist.  Standing on airex with moving 2.2# med ball in diagonals x 10 each side CGA.  PT Education - 09/23/20 1605    Education Details Started on initial HEP    Person(s) Educated Patient    Methods Explanation;Demonstration;Handout    Comprehension Verbalized understanding;Returned demonstration            PT Short Term Goals - 09/18/20 1235      PT SHORT TERM GOAL #1   Title Pt will be IND with initial HEP in order to indicate dec fall risk and improved functional mobility. (Target Date: 10/18/20)    Time 4    Period Weeks    Status New    Target Date 10/18/20      PT SHORT TERM GOAL #2   Title Pt will demonstrate improved control when performing 5TSS test and perform in </=11.5 secs to indicate improved control/strength.    Time 4    Period Weeks    Status New      PT SHORT TERM GOAL #3   Title Pt will improve FGA to >/=23/30 in order to indicate dec fall risk.    Time 4    Period Weeks    Status New      PT SHORT TERM GOAL #4   Title Pt will perform putting in controlled environment at S level in order to indicate safety  when golfing.    Time 4    Period Weeks    Status New      PT SHORT TERM GOAL #5   Title Pt will ambulate x 300' over varying outdoor surfaces with more normal BOS and more controlled eccentric DF to indicate safety with leisure activities.    Time 4    Period Weeks    Status New             PT Long Term Goals - 09/18/20 1240      PT LONG TERM GOAL #1   Title Pt will be IND with final HEP in order to indicate dec fall risk and improved functional mobility. (Target Date: 11/17/20)    Time 8    Period Weeks    Status New    Target Date 11/17/20      PT LONG TERM GOAL #2   Title Pt will improve FGA to >/=26/30 in order to indicate dec fall risk.    Time 8    Period Weeks    Status New      PT LONG TERM GOAL #3   Title Pt will perform golf swings outside of varied surfaces at S level (distant) with no overt LOB and good control during "set up" in order to indicate safety with golf game.    Time 8    Period Weeks    Status New      PT LONG TERM GOAL #4   Title Pt will ambulate >1000' over varying surfaces at mod I level without overt LOB/gait compensations in order to indicate safety with leisure activities.    Time 8    Period Weeks    Status New                 Plan - 09/23/20 1605    Clinical Impression Statement PT assessed vision and VOR with no significant issues noted. Pt does have slight overshoot on saccades that would make sense for the cerebellar ataxia. He had only 1 line difference on the visual acuity. Focused on establishing initial HEP for balance working on slow, controlled movements and trying to engage core more.This did help as patient practiced.  Personal Factors and Comorbidities Comorbidity 3+;Time since onset of injury/illness/exacerbation    Comorbidities see above    Examination-Activity Limitations Lift;Squat;Locomotion Level;Stairs;Stand    Examination-Participation Restrictions Community Activity;Yard Work;Shop    Stability/Clinical  Decision Making Evolving/Moderate complexity    Rehab Potential Good    PT Frequency 2x / week    PT Duration 8 weeks    PT Treatment/Interventions ADLs/Self Care Home Management;Aquatic Therapy;DME Instruction;Gait training;Stair training;Functional mobility training;Therapeutic activities;Therapeutic exercise;Balance training;Neuromuscular re-education;Patient/family education;Visual/perceptual remediation/compensation;Vestibular    PT Next Visit Plan Continue high level balance HEP, work on slow controlled movements, wants to work on golf!! Be sure to have him engage core with activities.    Consulted and Agree with Plan of Care Patient           Patient will benefit from skilled therapeutic intervention in order to improve the following deficits and impairments:  Abnormal gait,Decreased activity tolerance,Decreased balance,Decreased coordination,Decreased endurance,Decreased mobility,Impaired UE functional use,Postural dysfunction  Visit Diagnosis: Other abnormalities of gait and mobility  Unsteadiness on feet  Other lack of coordination     Problem List Patient Active Problem List   Diagnosis Date Noted  . Benign prostatic hyperplasia with urinary obstruction 07/30/2020  . Incomplete emptying of bladder 07/30/2020  . Essential hypertension 11/08/2014  . Hepatitis C 11/08/2014    Electa Sniff , PT, DPT, NCS 09/23/2020, 4:09 PM  [facility] [address] [place], [place], [address] Phone: [phone]   Fax:  [phone]  Name: Joseph Duffy MRN: [id number] Date of Birth: Dec 27, 1955

## 2020-09-25 ENCOUNTER — Encounter: Payer: Self-pay | Admitting: Physical Therapy

## 2020-09-25 ENCOUNTER — Other Ambulatory Visit: Payer: Self-pay

## 2020-09-25 ENCOUNTER — Ambulatory Visit: Payer: Medicare Other | Admitting: Physical Therapy

## 2020-09-25 DIAGNOSIS — R42 Dizziness and giddiness: Secondary | ICD-10-CM | POA: Diagnosis not present

## 2020-09-25 DIAGNOSIS — R2681 Unsteadiness on feet: Secondary | ICD-10-CM

## 2020-09-25 DIAGNOSIS — R278 Other lack of coordination: Secondary | ICD-10-CM | POA: Diagnosis not present

## 2020-09-25 DIAGNOSIS — R2689 Other abnormalities of gait and mobility: Secondary | ICD-10-CM | POA: Diagnosis not present

## 2020-09-25 NOTE — Patient Instructions (Signed)
Access Code: [email or web address] URL: [email or web address] Date: 09/25/2020 Prepared by: Willow Ora  Exercises Walking March - 1 x daily - 5 x weekly - 1 sets - 6-8 reps Tandem Walking with Counter Support - 1 x daily - 5 x weekly - 1 sets - 6-8 reps Alternating tapping cup at counter - 1 x daily - 5 x weekly - 2 sets - 10 reps  Added to program today: Heel Walking with Counter Support - 1 x daily - 5 x weekly - 1 sets - 6-8 reps Toe Walking with Counter Support - 1 x daily - 5 x weekly - 1 sets - 6-8 reps Wide Stance with Eyes Closed on Foam Pad - 1 x daily - 5 x weekly - 1 sets - 3 reps - 30 hold Wide Tandem Stance on Foam Pad with Eyes Closed - 1 x daily - 5 x weekly - 1 sets - 3 reps - 15 hold Wide Stance with Head Rotation on Foam Pad - 1 x daily - 5 x weekly - 1 sets - 10 reps

## 2020-09-25 NOTE — Therapy (Signed)
[facility] [address] [place], [place], [address] Phone: [phone]   Fax:  [phone]  Physical Therapy Treatment  Patient Details  Name: Joseph Duffy MRN: [id number] Date of Birth: 02-12-1956 Referring Provider (PT): William Dalton, MD   Encounter Date: 09/25/2020   PT End of Session - 09/25/20 0721    Visit Number 4    Number of Visits 17    Date for PT Re-Evaluation 11/17/20    Authorization Type UHC Medicare (10th visit PN needed)    Progress Note Due on Visit 10    PT Start Time 0717    PT Stop Time 0802    PT Time Calculation (min) 45 min    Equipment Utilized During Treatment Gait belt    Activity Tolerance Patient tolerated treatment well;No increased pain    Behavior During Therapy WFL for tasks assessed/performed           Past Medical History:  Diagnosis Date  . Ataxia   . Bell's palsy 12/2019   right side  . Cirrhosis of liver ([place])   . Hepatitis C   . Hypertension   . 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 No pertinent surgical history.  There were no vitals filed for this visit.   Subjective Assessment - 09/25/20 0720    Subjective No new complaints. No falls or pain to report. HEP is going well, still challenging. Played a full 18 course of golf yesterday.    Pertinent History Bells Palsy, HepC, HTN, sunlight sensitivity    Limitations Walking    How long can you walk comfortably? approx 30-45 mins    Patient Stated Goals "I want to stop this swaying."    Currently in Pain? No/denies                  [facility] Adult PT Treatment/Exercise - 09/25/20 0725      Transfers   Transfers Sit to Stand;Stand to Sit    Sit to Stand 6: Modified independent (Device/Increase time)    Stand to Sit 6: Modified independent (Device/Increase time)      Ambulation/Gait   Ambulation/Gait Yes    Ambulation/Gait Assistance 5: Supervision    Ambulation/Gait Assistance  Details around clinic in session    Assistive device None    Gait Pattern Step-through pattern;Ataxic;Wide base of support    Ambulation Surface Level;Indoor      High Level Balance   High Level Balance Activities Other (comment)    High Level Balance Comments at counter: toe walking, then heel walking for ~4-6 laps each with intermittent UE support on counter. Min guard to min assist for balance. cues on posture, form and to slow down.      Self-Care   Self-Care Other Self-Care Comments    Other Self-Care Comments  reviewed HEP. added new ex's for balance. Refer to [facility] for full details. Min guard assist for balance with cues on form and technique.      Neuro Re-ed    Neuro Re-ed Details  for balance/coordination: gait around track working on speed changes and scanning all directions randomly. veering/staggering noted. cues to slow down when scanning for improved balance needed.               Balance Exercises - 09/25/20 0740      Balance Exercises: Standing   Standing Eyes Opened Wide (BOA);Head turns;Foam/compliant surface;Other reps (comment);Limitations    Standing Eyes Opened Limitations on airex  with feet apart for head movements left<>right, up<>down ~10 reps each. Min guard assist for safety.    Standing Eyes Closed Narrow base of support (BOS);Wide (BOA);Foam/compliant surface;Other reps (comment);30 secs;Limitations    Standing Eyes Closed Limitations on airex- increased assistance (min) with instability noted with feet together for EC. had pt move feet to wide position with only min guard assistance needed for 3 reps. cues on posture and to engage core.    Partial Tandem Stance Eyes closed;Foam/compliant surface;Intermittent upper extremity support;3 reps;20 secs;Limitations    Partial Tandem Stance Limitations on ariex for 3 reps each foot forward with min guard assist for balance.             PT Education - 09/25/20 0840    Education Details reviewed and  updated HEP    Person(s) Educated Patient    Methods Explanation;Demonstration;Verbal cues;Handout    Comprehension Verbalized understanding;Returned demonstration;Verbal cues required;Need further instruction            PT Short Term Goals - 09/18/20 1235      PT SHORT TERM GOAL #1   Title Pt will be IND with initial HEP in order to indicate dec fall risk and improved functional mobility. (Target Date: 10/18/20)    Time 4    Period Weeks    Status New    Target Date 10/18/20      PT SHORT TERM GOAL #2   Title Pt will demonstrate improved control when performing 5TSS test and perform in </=11.5 secs to indicate improved control/strength.    Time 4    Period Weeks    Status New      PT SHORT TERM GOAL #3   Title Pt will improve FGA to >/=23/30 in order to indicate dec fall risk.    Time 4    Period Weeks    Status New      PT SHORT TERM GOAL #4   Title Pt will perform putting in controlled environment at S level in order to indicate safety when golfing.    Time 4    Period Weeks    Status New      PT SHORT TERM GOAL #5   Title Pt will ambulate x 300' over varying outdoor surfaces with more normal BOS and more controlled eccentric DF to indicate safety with leisure activities.    Time 4    Period Weeks    Status New             PT Long Term Goals - 09/18/20 1240      PT LONG TERM GOAL #1   Title Pt will be IND with final HEP in order to indicate dec fall risk and improved functional mobility. (Target Date: 11/17/20)    Time 8    Period Weeks    Status New    Target Date 11/17/20      PT LONG TERM GOAL #2   Title Pt will improve FGA to >/=26/30 in order to indicate dec fall risk.    Time 8    Period Weeks    Status New      PT LONG TERM GOAL #3   Title Pt will perform golf swings outside of varied surfaces at S level (distant) with no overt LOB and good control during "set up" in order to indicate safety with golf game.    Time 8    Period Weeks    Status  New      PT LONG TERM  GOAL #4   Title Pt will ambulate >1000' over varying surfaces at mod I level without overt LOB/gait compensations in order to indicate safety with leisure activities.    Time 8    Period Weeks    Status New                 Plan - 09/25/20 0721    Clinical Impression Statement Today's skilled session continued to focus on balance training with additions added to HEP. No issues noted or reported in session today. The pt is progressing toward goals and should benefit from continued PT to progress toward unmet goals.    Personal Factors and Comorbidities Comorbidity 3+;Time since onset of injury/illness/exacerbation    Comorbidities see above    Examination-Activity Limitations Lift;Squat;Locomotion Level;Stairs;Stand    Examination-Participation Restrictions Community Activity;Yard Work;Shop    Stability/Clinical Decision Making Evolving/Moderate complexity    Rehab Potential Good    PT Frequency 2x / week    PT Duration 8 weeks    PT Treatment/Interventions ADLs/Self Care Home Management;Aquatic Therapy;DME Instruction;Gait training;Stair training;Functional mobility training;Therapeutic activities;Therapeutic exercise;Balance training;Neuromuscular re-education;Patient/family education;Visual/perceptual remediation/compensation;Vestibular    PT Next Visit Plan Continue high level balance, work on slow controlled movements, wants to work on golf!! Be sure to have him engage core with activities.    PT Home Exercise Plan Access Code: [email or web address]    Consulted and Agree with Plan of Care Patient           Patient will benefit from skilled therapeutic intervention in order to improve the following deficits and impairments:  Abnormal gait,Decreased activity tolerance,Decreased balance,Decreased coordination,Decreased endurance,Decreased mobility,Impaired UE functional use,Postural dysfunction  Visit Diagnosis: Other abnormalities of gait and mobility  Unsteadiness  on feet  Other lack of coordination  Dizziness and giddiness     Problem List Patient Active Problem List   Diagnosis Date Noted  . Benign prostatic hyperplasia with urinary obstruction 07/30/2020  . Incomplete emptying of bladder 07/30/2020  . Essential hypertension 11/08/2014  . Hepatitis C 11/08/2014    Willow Ora, PTA, [facility] [address], [place], [place] [address] [phone] 09/25/20, 8:40 AM   Name: Joseph Duffy MRN: [id number] Date of Birth: August 29, 1955

## 2020-09-30 ENCOUNTER — Other Ambulatory Visit: Payer: Self-pay

## 2020-09-30 ENCOUNTER — Ambulatory Visit: Payer: Medicare Other

## 2020-09-30 DIAGNOSIS — R278 Other lack of coordination: Secondary | ICD-10-CM | POA: Diagnosis not present

## 2020-09-30 DIAGNOSIS — R2681 Unsteadiness on feet: Secondary | ICD-10-CM | POA: Diagnosis not present

## 2020-09-30 DIAGNOSIS — R2689 Other abnormalities of gait and mobility: Secondary | ICD-10-CM | POA: Diagnosis not present

## 2020-09-30 DIAGNOSIS — R42 Dizziness and giddiness: Secondary | ICD-10-CM | POA: Diagnosis not present

## 2020-09-30 NOTE — Therapy (Signed)
[facility] [address] [place], [place], [address] Phone: [phone]   Fax:  [phone]  Physical Therapy Treatment  Patient Details  Name: Joseph Duffy MRN: [id number] Date of Birth: 1955/12/20 Referring Provider (PT): William Dalton, MD   Encounter Date: 09/30/2020   PT End of Session - 09/30/20 0934    Visit Number 5    Number of Visits 17    Date for PT Re-Evaluation 11/17/20    Authorization Type UHC Medicare (10th visit PN needed)    Progress Note Due on Visit 10    PT Start Time 0932    PT Stop Time 1015    PT Time Calculation (min) 43 min    Equipment Utilized During Treatment Gait belt    Activity Tolerance Patient tolerated treatment well;No increased pain    Behavior During Therapy WFL for tasks assessed/performed           Past Medical History:  Diagnosis Date  . Ataxia   . Bell's palsy 12/2019   right side  . Cirrhosis of liver ([place])   . Hepatitis C   . Hypertension   . 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 No pertinent surgical history.  There were no vitals filed for this visit.   Subjective Assessment - 09/30/20 0934    Subjective Pt reports he played golf of Friday and Saturday. He is working on trying to use core more to help. Feels he is doing better walking to/from the golf cart.    Pertinent History Bells Palsy, HepC, HTN, sunlight sensitivity    Limitations Walking    How long can you walk comfortably? approx 30-45 mins    Patient Stated Goals "I want to stop this swaying."    Currently in Pain? No/denies                             [facility] Adult PT Treatment/Exercise - 09/30/20 1010      Ambulation/Gait   Ambulation/Gait Yes    Ambulation/Gait Assistance 5: Supervision    Ambulation/Gait Assistance Details around in clinic between activities    Assistive device None    Gait Pattern Step-through pattern;Ataxic;Wide base of support       Neuro Re-ed    Neuro Re-ed Details  Dynamic gait activities in the hallway: marching gait 40' x 2 with touching wall at times. Verbal cues to move slow and controlled. Backwards gait 40' x 2. Gait forwards with tossing 3.3# med ball hand to hand 40' x 2. CGA for safety with activities as needed. Reciprocal steps over floor ladder at fast pace x 2, diagonal steps over floor ladder x 2 slowly then at faster pace. Standing on airex feet apart with raising 3.3# med ball overhead x 10 then repeated with feet together. Pt was cued to engage core. Then performed D1 diagonals x 10 each side. Feet together on airex eyes closed x 30 sec CGA/min assist. Alternating toe taps to cone from airex with fingertip support x 10. Seated on physioball with cues for upright posture alternating arm lifts x 10 then with marching 10 x 2. Increased challenge with marching.                    PT Short Term Goals - 09/18/20 1235      PT SHORT TERM GOAL #1   Title Pt will be IND with initial HEP  in order to indicate dec fall risk and improved functional mobility. (Target Date: 10/18/20)    Time 4    Period Weeks    Status New    Target Date 10/18/20      PT SHORT TERM GOAL #2   Title Pt will demonstrate improved control when performing 5TSS test and perform in </=11.5 secs to indicate improved control/strength.    Time 4    Period Weeks    Status New      PT SHORT TERM GOAL #3   Title Pt will improve FGA to >/=23/30 in order to indicate dec fall risk.    Time 4    Period Weeks    Status New      PT SHORT TERM GOAL #4   Title Pt will perform putting in controlled environment at S level in order to indicate safety when golfing.    Time 4    Period Weeks    Status New      PT SHORT TERM GOAL #5   Title Pt will ambulate x 300' over varying outdoor surfaces with more normal BOS and more controlled eccentric DF to indicate safety with leisure activities.    Time 4    Period Weeks    Status New              PT Long Term Goals - 09/18/20 1240      PT LONG TERM GOAL #1   Title Pt will be IND with final HEP in order to indicate dec fall risk and improved functional mobility. (Target Date: 11/17/20)    Time 8    Period Weeks    Status New    Target Date 11/17/20      PT LONG TERM GOAL #2   Title Pt will improve FGA to >/=26/30 in order to indicate dec fall risk.    Time 8    Period Weeks    Status New      PT LONG TERM GOAL #3   Title Pt will perform golf swings outside of varied surfaces at S level (distant) with no overt LOB and good control during "set up" in order to indicate safety with golf game.    Time 8    Period Weeks    Status New      PT LONG TERM GOAL #4   Title Pt will ambulate >1000' over varying surfaces at mod I level without overt LOB/gait compensations in order to indicate safety with leisure activities.    Time 8    Period Weeks    Status New                 Plan - 09/30/20 1221    Clinical Impression Statement PT continued to focus on slow, controlled movements with dynamic tasks as well as balance on compliant surface. Pt had less ataxia with standing on compliant surface today when engaged core more.    Personal Factors and Comorbidities Comorbidity 3+;Time since onset of injury/illness/exacerbation    Comorbidities see above    Examination-Activity Limitations Lift;Squat;Locomotion Level;Stairs;Stand    Examination-Participation Restrictions Community Activity;Yard Work;Shop    Stability/Clinical Decision Making Evolving/Moderate complexity    Rehab Potential Good    PT Frequency 2x / week    PT Duration 8 weeks    PT Treatment/Interventions ADLs/Self Care Home Management;Aquatic Therapy;DME Instruction;Gait training;Stair training;Functional mobility training;Therapeutic activities;Therapeutic exercise;Balance training;Neuromuscular re-education;Patient/family education;Visual/perceptual remediation/compensation;Vestibular    PT Next Visit  Plan Continue high level balance, standing  on compliant surfaces, work on slow controlled movements, wants to work on Office manager!! Be sure to have him engage core with activities.    PT Home Exercise Plan Access Code: [email or web address]    Consulted and Agree with Plan of Care Patient           Patient will benefit from skilled therapeutic intervention in order to improve the following deficits and impairments:  Abnormal gait,Decreased activity tolerance,Decreased balance,Decreased coordination,Decreased endurance,Decreased mobility,Impaired UE functional use,Postural dysfunction  Visit Diagnosis: Other abnormalities of gait and mobility  Unsteadiness on feet  Other lack of coordination     Problem List Patient Active Problem List   Diagnosis Date Noted  . Benign prostatic hyperplasia with urinary obstruction 07/30/2020  . Incomplete emptying of bladder 07/30/2020  . Essential hypertension 11/08/2014  . Hepatitis C 11/08/2014    Electa Sniff, PT, DPT, NCS 09/30/2020, 12:23 PM  [facility] [address] [place], [place], [address] Phone: [phone]   Fax:  [phone]  Name: Joseph Duffy MRN: [id number] Date of Birth: [birth year]

## 2020-10-02 ENCOUNTER — Other Ambulatory Visit: Payer: Self-pay

## 2020-10-02 ENCOUNTER — Encounter: Payer: Self-pay | Admitting: Rehabilitation

## 2020-10-02 ENCOUNTER — Ambulatory Visit: Payer: Medicare Other | Admitting: Rehabilitation

## 2020-10-02 DIAGNOSIS — R278 Other lack of coordination: Secondary | ICD-10-CM | POA: Diagnosis not present

## 2020-10-02 DIAGNOSIS — R2689 Other abnormalities of gait and mobility: Secondary | ICD-10-CM | POA: Diagnosis not present

## 2020-10-02 DIAGNOSIS — R2681 Unsteadiness on feet: Secondary | ICD-10-CM

## 2020-10-02 DIAGNOSIS — R42 Dizziness and giddiness: Secondary | ICD-10-CM | POA: Diagnosis not present

## 2020-10-02 NOTE — Therapy (Signed)
[facility] [address] [place], [place], [address] Phone: [phone]   Fax:  [phone]  Physical Therapy Treatment  Patient Details  Name: Joseph Duffy MRN: [id number] Date of Birth: February 21, 1956 Referring Provider (PT): Joseph Dalton, MD   Encounter Date: 10/02/2020   PT End of Session - 10/02/20 1015    Visit Number 5   updated due to error at last visit   Number of Visits 17    Date for PT Re-Evaluation 11/17/20    Authorization Type [employer] Medicare (10th visit PN needed)    Progress Note Due on Visit 10    PT Start Time 0925    PT Stop Time 1010    PT Time Calculation (min) 45 min    Equipment Utilized During Treatment Gait belt    Activity Tolerance Patient tolerated treatment well;No increased pain    Behavior During Therapy WFL for tasks assessed/performed           Past Medical History:  Diagnosis Date  . Ataxia   . Bell's palsy 12/2019   right side  . Cirrhosis of liver ([place])   . Hepatitis C   . Hypertension   . 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 No pertinent surgical history.  There were no vitals filed for this visit.   Subjective Assessment - 10/02/20 0925    Subjective Reports he continues to think he is doing better, has some left low back pain.    Pertinent History Bells Palsy, HepC, HTN, sunlight sensitivity    How long can you walk comfortably? approx 30-45 mins    Patient Stated Goals "I want to stop this swaying."    Currently in Pain? Yes    Pain Score 2     Pain Location Back    Pain Orientation Left    Pain Descriptors / Indicators Nagging    Pain Type Chronic pain;Acute pain    Pain Radiating Towards into L buttocks and L leg    Pain Onset In the past 7 days    Pain Frequency Intermittent    Aggravating Factors  doing more activity/exercise    Pain Relieving Factors rest                             [facility] Adult PT  Treatment/Exercise - 10/02/20 0927      Ambulation/Gait   Ambulation/Gait Yes    Ambulation/Gait Assistance 5: Supervision    Ambulation/Gait Assistance Details Just around clinic, note smoother gait throughout vs previous sessions.  Less ataxic movements in LLE.    Assistive device None    Gait Pattern Step-through pattern;Ataxic;Wide base of support      Neuro Re-ed    Neuro Re-ed Details  Warm up on elliptical x 3 mins for smooth reciprocal movement with cues for upright posture throughout.  Quadruped LE extension (alt) x 10 reps with cues for core activation progressig to bird dog x 10 reps again with tactile cues and demo for TA activation,  R then L half kneeling moving 5lb kettle bell in D1 diagonal x 10 reps each, standing on mat with cone taps alternating LEs x 8 reps then tipping cone over and back upright x 8 reps with cues and light tactile facilitation for weight shift.  Standing on foam mat (blue mat folded) performing marching in place x 20 reps with intermittent UE support needed and cues for  core activation.  Same surface with feet together EO maintaining balance x 20 secs with minimal sway progressing to feet together horizontal and vertical head motions x 10 reps each with intermittent support as needed.  Ended with feet together EC x 2 reps of 20 secs.  Much more sway when visual and somatosensory systems removed, but able to perform with only a few UE touches.  Verbally reviewed corner exercises for HEP as he was not performing yet.  Pt verbalized understanding.      Exercises   Exercises Other Exercises    Other Exercises  Seated figure 4 piriformis stretch x 2 reps of 1 min due to reported "sciatic pain".  Also performed sitting on tennis ball for piriformis release x 2 mins                    PT Short Term Goals - 09/18/20 1235      PT SHORT TERM GOAL #1   Title Pt will be IND with initial HEP in order to indicate dec fall risk and improved functional mobility.  (Target Date: 10/18/20)    Time 4    Period Weeks    Status New    Target Date 10/18/20      PT SHORT TERM GOAL #2   Title Pt will demonstrate improved control when performing 5TSS test and perform in </=11.5 secs to indicate improved control/strength.    Time 4    Period Weeks    Status New      PT SHORT TERM GOAL #3   Title Pt will improve FGA to >/=23/30 in order to indicate dec fall risk.    Time 4    Period Weeks    Status New      PT SHORT TERM GOAL #4   Title Pt will perform putting in controlled environment at S level in order to indicate safety when golfing.    Time 4    Period Weeks    Status New      PT SHORT TERM GOAL #5   Title Pt will ambulate x 300' over varying outdoor surfaces with more normal BOS and more controlled eccentric DF to indicate safety with leisure activities.    Time 4    Period Weeks    Status New             PT Long Term Goals - 09/18/20 1240      PT LONG TERM GOAL #1   Title Pt will be IND with final HEP in order to indicate dec fall risk and improved functional mobility. (Target Date: 11/17/20)    Time 8    Period Weeks    Status New    Target Date 11/17/20      PT LONG TERM GOAL #2   Title Pt will improve FGA to >/=26/30 in order to indicate dec fall risk.    Time 8    Period Weeks    Status New      PT LONG TERM GOAL #3   Title Pt will perform golf swings outside of varied surfaces at S level (distant) with no overt LOB and good control during "set up" in order to indicate safety with golf game.    Time 8    Period Weeks    Status New      PT LONG TERM GOAL #4   Title Pt will ambulate >1000' over varying surfaces at mod I level without overt LOB/gait compensations in order  to indicate safety with leisure activities.    Time 8    Period Weeks    Status New                 Plan - 10/02/20 1015    Clinical Impression Statement Skilled session focused on slow controlled movements with high level coordination  exercises in both standing and quadruped/half kneeling.  Pt doing much better activating core and "setting up" during single leg balance tasks and demonstrates improved control on compliant surfaces.    Personal Factors and Comorbidities Comorbidity 3+;Time since onset of injury/illness/exacerbation    Comorbidities see above    Examination-Activity Limitations Lift;Squat;Locomotion Level;Stairs;Stand    Examination-Participation Restrictions Community Activity;Yard Work;Shop    Stability/Clinical Decision Making Evolving/Moderate complexity    Rehab Potential Good    PT Frequency 2x / week    PT Duration 8 weeks    PT Treatment/Interventions ADLs/Self Care Home Management;Aquatic Therapy;DME Instruction;Gait training;Stair training;Functional mobility training;Therapeutic activities;Therapeutic exercise;Balance training;Neuromuscular re-education;Patient/family education;Visual/perceptual remediation/compensation;Vestibular    PT Next Visit Plan Continue high level balance, standing on compliant surfaces, work on slow controlled movements, wants to work on golf!! Be sure to have him engage core with activities.    PT Home Exercise Plan Access Code: [email or web address]    Consulted and Agree with Plan of Care Patient           Patient will benefit from skilled therapeutic intervention in order to improve the following deficits and impairments:  Abnormal gait,Decreased activity tolerance,Decreased balance,Decreased coordination,Decreased endurance,Decreased mobility,Impaired UE functional use,Postural dysfunction  Visit Diagnosis: Other abnormalities of gait and mobility  Unsteadiness on feet  Other lack of coordination     Problem List Patient Active Problem List   Diagnosis Date Noted  . Benign prostatic hyperplasia with urinary obstruction 07/30/2020  . Incomplete emptying of bladder 07/30/2020  . Essential hypertension 11/08/2014  . Hepatitis C 11/08/2014    Cameron Sprang, PT,  MPT [facility] [address] [place], [place], [address] Phone: [phone]   Fax:  [phone] 10/02/20, 12:55 PM  Name: Joseph Duffy MRN: [id number] Date of Birth: [birth year]

## 2020-10-07 ENCOUNTER — Ambulatory Visit: Payer: Medicare Other

## 2020-10-09 ENCOUNTER — Ambulatory Visit: Payer: Medicare Other | Admitting: Physical Therapy

## 2020-10-14 ENCOUNTER — Other Ambulatory Visit: Payer: Self-pay

## 2020-10-14 ENCOUNTER — Ambulatory Visit: Payer: Medicare Other | Attending: Psychiatry

## 2020-10-14 DIAGNOSIS — R42 Dizziness and giddiness: Secondary | ICD-10-CM | POA: Diagnosis not present

## 2020-10-14 DIAGNOSIS — R2689 Other abnormalities of gait and mobility: Secondary | ICD-10-CM | POA: Diagnosis not present

## 2020-10-14 DIAGNOSIS — R278 Other lack of coordination: Secondary | ICD-10-CM | POA: Insufficient documentation

## 2020-10-14 DIAGNOSIS — R2681 Unsteadiness on feet: Secondary | ICD-10-CM

## 2020-10-14 NOTE — Therapy (Signed)
[facility] [address] [place], [place], [address] Phone: [phone]   Fax:  [phone]  Physical Therapy Treatment  Patient Details  Name: Joseph Duffy MRN: [id number] Date of Birth: 1956/07/05 Referring Provider (PT): William Dalton, MD   Encounter Date: 10/14/2020   PT End of Session - 10/14/20 0932    Visit Number 6    Number of Visits 17    Date for PT Re-Evaluation 11/17/20    Authorization Type [employer] Medicare (10th visit PN needed)    Progress Note Due on Visit 10    PT Start Time 0930    PT Stop Time 1013    PT Time Calculation (min) 43 min    Equipment Utilized During Treatment Gait belt    Activity Tolerance Patient tolerated treatment well;No increased pain    Behavior During Therapy WFL for tasks assessed/performed           Past Medical History:  Diagnosis Date  . Ataxia   . Bell's palsy 12/2019   right side  . Cirrhosis of liver ([place])   . Hepatitis C   . Hypertension   . 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 No pertinent surgical history.  There were no vitals filed for this visit.   Subjective Assessment - 10/14/20 0932    Subjective Pt missed last week as he had a family emergency and had to go out of town. He tried to work on exercises some but could tell when he didn't get them in as much as he would like. He played golf when got back and did ok.    Pertinent History Bells Palsy, HepC, HTN, sunlight sensitivity    How long can you walk comfortably? approx 30-45 mins    Patient Stated Goals "I want to stop this swaying."    Currently in Pain? No/denies    Pain Onset In the past 7 days                             [facility] Adult PT Treatment/Exercise - 10/14/20 0935      Transfers   Five time sit to stand comments  8.53 sec from mat without hands      Ambulation/Gait   Ambulation/Gait Yes    Ambulation/Gait Assistance 5: Supervision     Ambulation/Gait Assistance Details Verbal cues to increase step length and heel strike and try to get more arm swing.    Ambulation Distance (Feet) 850 Feet    Assistive device None    Gait Pattern Step-through pattern;Ataxic;Wide base of support;Decreased arm swing - right;Decreased arm swing - left    Ambulation Surface Level;Unlevel;Indoor;Outdoor;Paved;Grass      Neuro Re-ed    Neuro Re-ed Details  Simulated putting activities on blue mat: standing on airex x 6 putts then standing on rockerboard maintaining level first x 30 sec then 6 putts with board positioned ant/post then lateral x 6 putts. Pt less steady with board lateral with increased ataxia but significant improvement overall in balance on rockerboard. Cued to keep tummy tight to help with stability. CGA for safety. Outside swinging golf club x 10 swings with no LOB and good form noted. Pt being sure he got stabilized well before swinging.                    PT Short Term Goals - 10/14/20 1011  PT SHORT TERM GOAL #1   Title Pt will be IND with initial HEP in order to indicate dec fall risk and improved functional mobility. (Target Date: 10/18/20)    Time 4    Period Weeks    Status New    Target Date 10/18/20      PT SHORT TERM GOAL #2   Title Pt will demonstrate improved control when performing 5TSS test and perform in </=11.5 secs to indicate improved control/strength.    Baseline 10/14/20 8.53 sec without hands from mat    Period Weeks    Status Achieved      PT SHORT TERM GOAL #3   Title Pt will improve FGA to >/=23/30 in order to indicate dec fall risk.    Time 4    Period Weeks    Status New      PT SHORT TERM GOAL #4   Title Pt will perform putting in controlled environment at S level in order to indicate safety when golfing.    Baseline Pt was able to perform putting and swinging golf club independently in grass.    Time 4    Period Weeks    Status Achieved      PT SHORT TERM GOAL #5   Title Pt  will ambulate x 300' over varying outdoor surfaces with more normal BOS and more controlled eccentric DF to indicate safety with leisure activities.    Baseline 10/14/20 Pt ambulated outside on varied surfaces independently with improved foot placement    Time 4    Period Weeks    Status Achieved             PT Long Term Goals - 09/18/20 1240      PT LONG TERM GOAL #1   Title Pt will be IND with final HEP in order to indicate dec fall risk and improved functional mobility. (Target Date: 11/17/20)    Time 8    Period Weeks    Status New    Target Date 11/17/20      PT LONG TERM GOAL #2   Title Pt will improve FGA to >/=26/30 in order to indicate dec fall risk.    Time 8    Period Weeks    Status New      PT LONG TERM GOAL #3   Title Pt will perform golf swings outside of varied surfaces at S level (distant) with no overt LOB and good control during "set up" in order to indicate safety with golf game.    Time 8    Period Weeks    Status New      PT LONG TERM GOAL #4   Title Pt will ambulate >1000' over varying surfaces at mod I level without overt LOB/gait compensations in order to indicate safety with leisure activities.    Time 8    Period Weeks    Status New                 Plan - 10/14/20 1906    Clinical Impression Statement PT started checking  STGs today. Pt met 5 x sit to stand goal, putting goal and gait goal. He is showing improving stability with balance activities. Less ataxia on rockerboard today needing less assistance.    Personal Factors and Comorbidities Comorbidity 3+;Time since onset of injury/illness/exacerbation    Comorbidities see above    Examination-Activity Limitations Lift;Squat;Locomotion Level;Stairs;Stand    Examination-Participation Restrictions Community Activity;Yard Work;Shop    [employer]  Evolving/Moderate complexity    Rehab Potential Good    PT Frequency 2x / week    PT Duration 8 weeks    PT  Treatment/Interventions ADLs/Self Care Home Management;Aquatic Therapy;DME Instruction;Gait training;Stair training;Functional mobility training;Therapeutic activities;Therapeutic exercise;Balance training;Neuromuscular re-education;Patient/family education;Visual/perceptual remediation/compensation;Vestibular    PT Next Visit Plan Check remaining STGs. Continue high level balance, standing on compliant surfaces, work on slow controlled movements, wants to work on Office manager!! Be sure to have him engage core with activities.    PT Home Exercise Plan Access Code: [email or web address]    Consulted and Agree with Plan of Care Patient           Patient will benefit from skilled therapeutic intervention in order to improve the following deficits and impairments:  Abnormal gait,Decreased activity tolerance,Decreased balance,Decreased coordination,Decreased endurance,Decreased mobility,Impaired UE functional use,Postural dysfunction  Visit Diagnosis: Other abnormalities of gait and mobility  Unsteadiness on feet     Problem List Patient Active Problem List   Diagnosis Date Noted  . Benign prostatic hyperplasia with urinary obstruction 07/30/2020  . Incomplete emptying of bladder 07/30/2020  . Essential hypertension 11/08/2014  . Hepatitis C 11/08/2014    Electa Sniff, PT, DPT, NCS 10/14/2020, 7:08 PM  [facility] [address] [place], [place], [address] Phone: [phone]   Fax:  [phone]  Name: JULUS KELLEY MRN: [id number] Date of Birth: 03/23/1956

## 2020-10-16 ENCOUNTER — Ambulatory Visit: Payer: Medicare Other

## 2020-10-16 ENCOUNTER — Other Ambulatory Visit: Payer: Self-pay

## 2020-10-16 DIAGNOSIS — R2689 Other abnormalities of gait and mobility: Secondary | ICD-10-CM | POA: Diagnosis not present

## 2020-10-16 DIAGNOSIS — R2681 Unsteadiness on feet: Secondary | ICD-10-CM

## 2020-10-16 DIAGNOSIS — R278 Other lack of coordination: Secondary | ICD-10-CM | POA: Diagnosis not present

## 2020-10-16 DIAGNOSIS — R42 Dizziness and giddiness: Secondary | ICD-10-CM | POA: Diagnosis not present

## 2020-10-16 NOTE — Patient Instructions (Addendum)
Access Code: [email or web address] URL: [email or web address] Date: 10/16/2020 Prepared by: Cherly Anderson  Exercises Walking March - 1 x daily - 5 x weekly - 1 sets - 6-8 reps Tandem Walking with Counter Support - 1 x daily - 5 x weekly - 1 sets - 6-8 reps Alternating tapping cup at counter - 1 x daily - 5 x weekly - 2 sets - 10 reps Heel Walking with Counter Support - 1 x daily - 5 x weekly - 1 sets - 6-8 reps Toe Walking with Counter Support - 1 x daily - 5 x weekly - 1 sets - 6-8 reps Wide Stance with Eyes Closed on Foam Pad - 1 x daily - 5 x weekly - 1 sets - 3 reps - 30 hold Wide Tandem Stance on Foam Pad with Eyes Closed - 1 x daily - 5 x weekly - 1 sets - 3 reps - 15 hold Wide Stance with Head Rotation on Foam Pad - 1 x daily - 5 x weekly - 1 sets - 10 reps Mini Squat with Chair - 2 x daily - 5 x weekly - 2 sets - 10 reps

## 2020-10-16 NOTE — Therapy (Signed)
[facility] [address] [place], [place], [address] Phone: [phone]   Fax:  [phone]  Physical Therapy Treatment  Patient Details  Name: Joseph Duffy MRN: [id number] Date of Birth: 03-22-1956 Referring Provider (PT): William Dalton, MD   Encounter Date: 10/16/2020   PT End of Session - 10/16/20 0937    Visit Number 7    Number of Visits 17    Date for PT Re-Evaluation 11/17/20    Authorization Type [employer] Medicare (10th visit PN needed)    Progress Note Due on Visit 10    PT Start Time 0935    PT Stop Time 1014    PT Time Calculation (min) 39 min    Equipment Utilized During Treatment Gait belt    Activity Tolerance Patient tolerated treatment well;No increased pain    Behavior During Therapy WFL for tasks assessed/performed           Past Medical History:  Diagnosis Date  . Ataxia   . Bell's palsy 12/2019   right side  . Cirrhosis of liver ([place])   . Hepatitis C   . Hypertension   . 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 No pertinent surgical history.  There were no vitals filed for this visit.   Subjective Assessment - 10/16/20 0938    Subjective Pt reports that he played golf yesterday and did well. Felt like balance was pretty good.    Pertinent History Bells Palsy, HepC, HTN, sunlight sensitivity    How long can you walk comfortably? approx 30-45 mins    Patient Stated Goals "I want to stop this swaying."    Currently in Pain? No/denies    Pain Onset In the past 7 days              [facility] PT Assessment - 10/16/20 0938      Functional Gait  Assessment   Gait assessed  Yes    Gait Level Surface Walks 20 ft in less than 5.5 sec, no assistive devices, good speed, no evidence for imbalance, normal gait pattern, deviates no more than 6 in outside of the 12 in walkway width.    Change in Gait Speed Able to smoothly change walking speed without loss of balance or gait  deviation. Deviate no more than 6 in outside of the 12 in walkway width.    Gait with Horizontal Head Turns Performs head turns smoothly with slight change in gait velocity (eg, minor disruption to smooth gait path), deviates 6-10 in outside 12 in walkway width, or uses an assistive device.    Gait with Vertical Head Turns Performs head turns with no change in gait. Deviates no more than 6 in outside 12 in walkway width.    Gait and Pivot Turn Pivot turns safely within 3 sec and stops quickly with no loss of balance.    Step Over Obstacle Is able to step over 2 stacked shoe boxes taped together (9 in total height) without changing gait speed. No evidence of imbalance.    Gait with Narrow Base of Support Ambulates 4-7 steps.    Gait with Eyes Closed Walks 20 ft, uses assistive device, slower speed, mild gait deviations, deviates 6-10 in outside 12 in walkway width. Ambulates 20 ft in less than 9 sec but greater than 7 sec.    Ambulating Backwards Walks 20 ft, uses assistive device, slower speed, mild gait deviations, deviates 6-10 in outside 12 in walkway width.  Steps Alternating feet, no rail.    Total Score 25                         [facility] Adult PT Treatment/Exercise - 10/16/20 0938      Ambulation/Gait   Ambulation/Gait Yes    Ambulation/Gait Assistance 5: Supervision    Ambulation/Gait Assistance Details around in clinic with activities    Assistive device None    Gait Pattern Step-through pattern;Ataxic    Ambulation Surface Level;Indoor      Neuro Re-ed    Neuro Re-ed Details  Reviewed [facility] HEP as noted below. Also performed standing on airex with UE D1 diagonals with 3.3# med ball x 10 each side. Verbal cues to keep core tight.            Exercises Walking March - 1 x daily - 5 x weekly - 1 sets - 6-8 reps- performed 3 laps along counter. Verbal cues to take his time and set feet down slowly. Tandem Walking with Counter Support - 1 x daily - 5 x weekly - 1  sets - 6-8 reps- with occasional UE support on counter x 3 laps.  Alternating tapping cup at counter - 1 x daily - 5 x weekly - 2 sets - 10 reps- performed x 10 on ground and then x 10 standing on pillow. Verbal cues to engage core and squeeze bottom to help with control. Heel Walking with Counter Support - 1 x daily - 5 x weekly - 1 sets - 6-8 reps- x 3 laps along counter down and back Toe Walking with Counter Support - 1 x daily - 5 x weekly - 1 sets - 6-8 reps- x 3 laps along counter down and back Wide Stance with Eyes Closed on Foam Pad - 1 x daily - 5 x weekly - 1 sets - 3 reps - 30 hold- performed 30 sec on airex in corner with increased sway Wide Tandem Stance on Foam Pad with Eyes Closed - 1 x daily - 5 x weekly - 1 sets - 3 reps - 15 hold- performed 30 sec each position on airex Wide Stance with Head Rotation on Foam Pad - 1 x daily - 5 x weekly - 1 sets - 10 reps Mini Squat with Chair - 2 x daily - 5 x weekly - 2 sets - 10 reps- performed on airex in corner        PT Education - 10/16/20 1012    Education Details Added mini-squat on pillow and pillow with performing alternating toe taps. Discussed results of FGA and improvement thus far    Person(s) Educated Patient    Methods Explanation;Demonstration;Handout    Comprehension Verbalized understanding;Returned demonstration            PT Short Term Goals - 10/16/20 0945      PT SHORT TERM GOAL #1   Title Pt will be IND with initial HEP in order to indicate dec fall risk and improved functional mobility. (Target Date: 10/18/20)    Baseline 10/16/20 Pt has been performing HEP as instructed. Reviewed today and did well.    Time 4    Period Weeks    Status Achieved    Target Date 10/18/20      PT SHORT TERM GOAL #2   Title Pt will demonstrate improved control when performing 5TSS test and perform in </=11.5 secs to indicate improved control/strength.    Baseline 10/14/20 8.53 sec without hands  from mat    Period Weeks    Status  Achieved      PT SHORT TERM GOAL #3   Title Pt will improve FGA to >/=23/30 in order to indicate dec fall risk.    Baseline 10/16/20 25/30    Time 4    Period Weeks    Status Achieved      PT SHORT TERM GOAL #4   Title Pt will perform putting in controlled environment at S level in order to indicate safety when golfing.    Baseline Pt was able to perform putting and swinging golf club independently in grass.    Time 4    Period Weeks    Status Achieved      PT SHORT TERM GOAL #5   Title Pt will ambulate x 300' over varying outdoor surfaces with more normal BOS and more controlled eccentric DF to indicate safety with leisure activities.    Baseline 10/14/20 Pt ambulated outside on varied surfaces independently with improved foot placement    Time 4    Period Weeks    Status Achieved             PT Long Term Goals - 09/18/20 1240      PT LONG TERM GOAL #1   Title Pt will be IND with final HEP in order to indicate dec fall risk and improved functional mobility. (Target Date: 11/17/20)    Time 8    Period Weeks    Status New    Target Date 11/17/20      PT LONG TERM GOAL #2   Title Pt will improve FGA to >/=26/30 in order to indicate dec fall risk.    Time 8    Period Weeks    Status New      PT LONG TERM GOAL #3   Title Pt will perform golf swings outside of varied surfaces at S level (distant) with no overt LOB and good control during "set up" in order to indicate safety with golf game.    Time 8    Period Weeks    Status New      PT LONG TERM GOAL #4   Title Pt will ambulate >1000' over varying surfaces at mod I level without overt LOB/gait compensations in order to indicate safety with leisure activities.    Time 8    Period Weeks    Status New                 Plan - 10/16/20 1327    Clinical Impression Statement Pt met all STGs today. He improved on FGA to 25/30 placing in low fall risk category now. Pt continues to show improved control with less ataxic  movements especially when he engages core and with practice. He is performing initial HEP well. Continues to benefit from skilled PT to continue to progress towards LTGs.    Personal Factors and Comorbidities Comorbidity 3+;Time since onset of injury/illness/exacerbation    Comorbidities see above    Examination-Activity Limitations Lift;Squat;Locomotion Level;Stairs;Stand    Examination-Participation Restrictions Community Activity;Yard Work;Shop    Stability/Clinical Decision Making Evolving/Moderate complexity    Rehab Potential Good    PT Frequency 2x / week    PT Duration 8 weeks    PT Treatment/Interventions ADLs/Self Care Home Management;Aquatic Therapy;DME Instruction;Gait training;Stair training;Functional mobility training;Therapeutic activities;Therapeutic exercise;Balance training;Neuromuscular re-education;Patient/family education;Visual/perceptual remediation/compensation;Vestibular    PT Next Visit Plan Continue high level balance, standing on compliant surfaces, work on slow controlled movements,  wants to work on golf!! Be sure to have him engage core with activities.    PT Home Exercise Plan Access Code: [email or web address]    Consulted and Agree with Plan of Care Patient           Patient will benefit from skilled therapeutic intervention in order to improve the following deficits and impairments:  Abnormal gait,Decreased activity tolerance,Decreased balance,Decreased coordination,Decreased endurance,Decreased mobility,Impaired UE functional use,Postural dysfunction  Visit Diagnosis: Other abnormalities of gait and mobility  Unsteadiness on feet     Problem List Patient Active Problem List   Diagnosis Date Noted  . Benign prostatic hyperplasia with urinary obstruction 07/30/2020  . Incomplete emptying of bladder 07/30/2020  . Essential hypertension 11/08/2014  . Hepatitis C 11/08/2014    Electa Sniff, PT, DPT, NCS 10/16/2020, 1:29 PM  [facility] [address] [place], [place], [address] Phone: [phone]   Fax:  [phone]  Name: Joseph Duffy MRN: [id number] Date of Birth: 1956/07/07

## 2020-10-17 DIAGNOSIS — F17210 Nicotine dependence, cigarettes, uncomplicated: Secondary | ICD-10-CM | POA: Diagnosis not present

## 2020-10-17 DIAGNOSIS — Z299 Encounter for prophylactic measures, unspecified: Secondary | ICD-10-CM | POA: Diagnosis not present

## 2020-10-17 DIAGNOSIS — Z0000 Encounter for general adult medical examination without abnormal findings: Secondary | ICD-10-CM | POA: Diagnosis not present

## 2020-10-17 DIAGNOSIS — Z79899 Other long term (current) drug therapy: Secondary | ICD-10-CM | POA: Diagnosis not present

## 2020-10-17 DIAGNOSIS — I10 Essential (primary) hypertension: Secondary | ICD-10-CM | POA: Diagnosis not present

## 2020-10-17 DIAGNOSIS — Z7189 Other specified counseling: Secondary | ICD-10-CM | POA: Diagnosis not present

## 2020-10-17 DIAGNOSIS — R5383 Other fatigue: Secondary | ICD-10-CM | POA: Diagnosis not present

## 2020-10-17 DIAGNOSIS — K7460 Unspecified cirrhosis of liver: Secondary | ICD-10-CM | POA: Diagnosis not present

## 2020-10-21 ENCOUNTER — Ambulatory Visit: Payer: Medicare Other

## 2020-10-21 ENCOUNTER — Other Ambulatory Visit: Payer: Self-pay

## 2020-10-21 DIAGNOSIS — R278 Other lack of coordination: Secondary | ICD-10-CM | POA: Diagnosis not present

## 2020-10-21 DIAGNOSIS — R2689 Other abnormalities of gait and mobility: Secondary | ICD-10-CM | POA: Diagnosis not present

## 2020-10-21 DIAGNOSIS — R42 Dizziness and giddiness: Secondary | ICD-10-CM | POA: Diagnosis not present

## 2020-10-21 DIAGNOSIS — R2681 Unsteadiness on feet: Secondary | ICD-10-CM

## 2020-10-21 NOTE — Therapy (Signed)
[facility] [address] [place], [place], [address] Phone: [phone]   Fax:  [phone]  Physical Therapy Treatment  Patient Details  Name: Joseph Duffy MRN: [id number] Date of Birth: 12/26/1955 Referring Provider (PT): William Dalton, MD   Encounter Date: 10/21/2020   PT End of Session - 10/21/20 1242    Visit Number 8    Number of Visits 17    Date for PT Re-Evaluation 11/17/20    Authorization Type [employer] Medicare (10th visit PN needed)    Progress Note Due on Visit 10    PT Start Time 0930    PT Stop Time 1012    PT Time Calculation (min) 42 min    Equipment Utilized During Treatment Gait belt    Activity Tolerance Patient tolerated treatment well;No increased pain    Behavior During Therapy WFL for tasks assessed/performed           Past Medical History:  Diagnosis Date  . Ataxia   . Bell's palsy 12/2019   right side  . Cirrhosis of liver ([place])   . Hepatitis C   . Hypertension   . 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 No pertinent surgical history.  There were no vitals filed for this visit.   Subjective Assessment - 10/21/20 0934    Subjective Pt reports he is doing well.  He was able to play two days of golf over the weekend.  He said his game is getting better.    Pertinent History Bells Palsy, HepC, HTN, sunlight sensitivity    How long can you walk comfortably? approx 30-45 mins    Patient Stated Goals "I want to stop this swaying."    Currently in Pain? No/denies    Pain Onset In the past 7 days                             [facility] Adult PT Treatment/Exercise - 10/21/20 1238      Neuro Re-ed    Neuro Re-ed Details  on bosu black side up, x10 weightshifts ant/post with intermittent UE support.  Blue side up 2x10 mini suqats, x15 throws to therapist, intermittent UE support and CGA.               Balance Exercises - 10/21/20 1016       Balance Exercises: Standing   Standing Eyes Opened Wide (BOA);Foam/compliant surface;Limitations    Standing Eyes Opened Limitations on blue airex pad with 3lb weighted ball; x10 trunk rotations R/L, x10 woodchoppers each direction; no significant instability or ataxia noted    Rockerboard Anterior/posterior;Lateral;Intermittent UE support;Limitations    Rockerboard Limitations x15 lateral ball throws with therapist focus on full trunk rotation; 2x30sec each ant/post and lateral stance with reaches to therapsit hand outside BOS    Step Ups Forward;Lateral;Intermittent UE support;Limitations   onto blue airex pad   Step Ups Limitations foot stationary on pad stepping forward and then backwards onto solid surface x15ea             PT Education - 10/21/20 1241    Education Details Continued improvement, remebering to engage core during exercises    Person(s) Educated Patient    Methods Explanation    Comprehension Verbalized understanding            PT Short Term Goals - 10/16/20 0945      PT SHORT TERM GOAL #1  Title Pt will be IND with initial HEP in order to indicate dec fall risk and improved functional mobility. (Target Date: 10/18/20)    Baseline 10/16/20 Pt has been performing HEP as instructed. Reviewed today and did well.    Time 4    Period Weeks    Status Achieved    Target Date 10/18/20      PT SHORT TERM GOAL #2   Title Pt will demonstrate improved control when performing 5TSS test and perform in </=11.5 secs to indicate improved control/strength.    Baseline 10/14/20 8.53 sec without hands from mat    Period Weeks    Status Achieved      PT SHORT TERM GOAL #3   Title Pt will improve FGA to >/=23/30 in order to indicate dec fall risk.    Baseline 10/16/20 25/30    Time 4    Period Weeks    Status Achieved      PT SHORT TERM GOAL #4   Title Pt will perform putting in controlled environment at S level in order to indicate safety when golfing.    Baseline Pt was able  to perform putting and swinging golf club independently in grass.    Time 4    Period Weeks    Status Achieved      PT SHORT TERM GOAL #5   Title Pt will ambulate x 300' over varying outdoor surfaces with more normal BOS and more controlled eccentric DF to indicate safety with leisure activities.    Baseline 10/14/20 Pt ambulated outside on varied surfaces independently with improved foot placement    Time 4    Period Weeks    Status Achieved             PT Long Term Goals - 09/18/20 1240      PT LONG TERM GOAL #1   Title Pt will be IND with final HEP in order to indicate dec fall risk and improved functional mobility. (Target Date: 11/17/20)    Time 8    Period Weeks    Status New    Target Date 11/17/20      PT LONG TERM GOAL #2   Title Pt will improve FGA to >/=26/30 in order to indicate dec fall risk.    Time 8    Period Weeks    Status New      PT LONG TERM GOAL #3   Title Pt will perform golf swings outside of varied surfaces at S level (distant) with no overt LOB and good control during "set up" in order to indicate safety with golf game.    Time 8    Period Weeks    Status New      PT LONG TERM GOAL #4   Title Pt will ambulate >1000' over varying surfaces at mod I level without overt LOB/gait compensations in order to indicate safety with leisure activities.    Time 8    Period Weeks    Status New                 Plan - 10/21/20 1243    Clinical Impression Statement Conitnued progression of balance, single leg stance time and core stability.  Addition of bosu due to higher level balance needs.  Pt required intermittent UE support throughout.  Continuation of rotational movements challenges core.  Noticable ataxic movements noted during fatigue and bosu exericses but continues to improve ability to control ataxic movements when he stabilizes through his  core.    Personal Factors and Comorbidities Comorbidity 3+;Time since onset of injury/illness/exacerbation     Comorbidities see above    Examination-Activity Limitations Lift;Squat;Locomotion Level;Stairs;Stand    Examination-Participation Restrictions Community Activity;Yard Work;Shop    Stability/Clinical Decision Making Evolving/Moderate complexity    Rehab Potential Good    PT Frequency 2x / week    PT Duration 8 weeks    PT Treatment/Interventions ADLs/Self Care Home Management;Aquatic Therapy;DME Instruction;Gait training;Stair training;Functional mobility training;Therapeutic activities;Therapeutic exercise;Balance training;Neuromuscular re-education;Patient/family education;Visual/perceptual remediation/compensation;Vestibular    PT Next Visit Plan Continue high level balance, standing on compliant surfaces, work on slow controlled movements, wants to work on golf!! Be sure to have him engage core with activities.    PT Home Exercise Plan Access Code: [email or web address]    Consulted and Agree with Plan of Care Patient           Patient will benefit from skilled therapeutic intervention in order to improve the following deficits and impairments:  Abnormal gait,Decreased activity tolerance,Decreased balance,Decreased coordination,Decreased endurance,Decreased mobility,Impaired UE functional use,Postural dysfunction  Visit Diagnosis: Other abnormalities of gait and mobility  Unsteadiness on feet     Problem List Patient Active Problem List   Diagnosis Date Noted  . Benign prostatic hyperplasia with urinary obstruction 07/30/2020  . Incomplete emptying of bladder 07/30/2020  . Essential hypertension 11/08/2014  . Hepatitis C 11/08/2014    Yetta Numbers, SPT 10/21/2020, 12:55 PM  [facility] [address] [place], [place], [address] Phone: [phone]   Fax:  [phone]  Name: Joseph Duffy MRN: [id number] Date of Birth: Sep 26, 1955

## 2020-10-23 ENCOUNTER — Ambulatory Visit: Payer: Medicare Other | Admitting: Physical Therapy

## 2020-10-23 ENCOUNTER — Other Ambulatory Visit: Payer: Self-pay

## 2020-10-23 ENCOUNTER — Encounter: Payer: Self-pay | Admitting: Physical Therapy

## 2020-10-23 DIAGNOSIS — R42 Dizziness and giddiness: Secondary | ICD-10-CM | POA: Diagnosis not present

## 2020-10-23 DIAGNOSIS — R2689 Other abnormalities of gait and mobility: Secondary | ICD-10-CM | POA: Diagnosis not present

## 2020-10-23 DIAGNOSIS — R2681 Unsteadiness on feet: Secondary | ICD-10-CM

## 2020-10-23 DIAGNOSIS — R278 Other lack of coordination: Secondary | ICD-10-CM | POA: Diagnosis not present

## 2020-10-24 NOTE — Therapy (Signed)
[facility] [address] [place], [place], [address] Phone: [phone]   Fax:  [phone]  Physical Therapy Treatment  Patient Details  Name: STCLAIR SZYMBORSKI MRN: [id number] Date of Birth: April 22, 1956 Referring Provider (PT): William Dalton, MD   Encounter Date: 10/23/2020     10/23/20 0721  PT Visits / Re-Eval  Visit Number 9  Number of Visits 17  Date for PT Re-Evaluation 11/17/20  Authorization  Authorization Type [employer] Medicare (10th visit PN needed)  Progress Note Due on Visit 10  PT Time Calculation  PT Start Time 0718  PT Stop Time 0756  PT Time Calculation (min) 38 min  PT - End of Session  Equipment Utilized During Treatment Gait belt  Activity Tolerance Patient tolerated treatment well;No increased pain  Behavior During Therapy WFL for tasks assessed/performed    Past Medical History:  Diagnosis Date  . Ataxia   . Bell's palsy 12/2019   right side  . Cirrhosis of liver ([place])   . Hepatitis C   . Hypertension   . 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 No pertinent surgical history.  There were no vitals filed for this visit.     10/23/20 0720  Symptoms/Limitations  Subjective No new complaints. No falls or pain to report. Played 18 holes of golf this week with no issues, felt like he was able to stabilize himself.  Pertinent History Bells Palsy, HepC, HTN, sunlight sensitivity  Limitations Walking  How long can you walk comfortably? approx 30-45 mins  Patient Stated Goals "I want to stop this swaying."  Pain Assessment  Currently in Pain? No/denies  Pain Score 0      10/23/20 0722  Transfers  Transfers Sit to Stand;Stand to Sit  Sit to Stand 6: Modified independent (Device/Increase time)  Stand to Sit 6: Modified independent (Device/Increase time)  Ambulation/Gait  Ambulation/Gait Yes  Ambulation/Gait Assistance 5: Supervision;4: Min guard  Ambulation/Gait  Assistance Details mild veering noted due to ataxia with no overt loss of balance.  Ambulation Distance (Feet) 1000 Feet (x1)  Assistive device None  Gait Pattern Step-through pattern;Ataxic  Ambulation Surface Level;Unlevel;Indoor;Outdoor;Paved      10/23/20 0001  Balance Exercises: Standing  SLS with Vectors Foam/compliant surface;Intermittent upper extremity assist;Other reps (comment);Limitations  SLS with Vectors Limitations on airex with 2 tall cones in front- alternating fwd foot taps for ~10 reps each with emphasis on posture and slow/controlled movements with no UE support. min guard to min assist for balance.  Rockerboard Anterior/posterior;Lateral;EO;EC;30 seconds;Other reps (comment);Limitations  Rockerboard Limitations perormed both ways on balance board- rocking the board with emphasis on tall posture with EO (min guard assist) progressing to [facility] with increased assistance with EC (min assist) needed. cues on posture/abd bracing to assist with balance. Then holding  baord steady for EC 30 sec's x 3 reps with min guard to min assist. cues on weight shifitng/posture to assist with balance.  Balance Beam standing across blue foam beam- alternating forward stepping to floor/back onto beam, then alternating backward stepping to floor/back onto beam with no UE support. cues for step length/height and step placement on return to beam. Min guard to min assist for balance.  Tandem Gait Forward;Retro;Intermittent upper extremity support;4 reps;Limitations  Tandem Gait Limitations blue foam beam with min guard to min assist with occasianal touch to bars, cues for step/foot placement on beam for more tandem position vs modified tandem position  Sidestepping Foam/compliant support;4 reps;Limitations (intermittent UE support)  Sidestepping Limitations blue foam beam with emphasis on step length/height, posture and intermittent touch to bars. min guard to min assist for balance.         PT  Short Term Goals - 10/16/20 0945      PT SHORT TERM GOAL #1   Title Pt will be IND with initial HEP in order to indicate dec fall risk and improved functional mobility. (Target Date: 10/18/20)    Baseline 10/16/20 Pt has been performing HEP as instructed. Reviewed today and did well.    Time 4    Period Weeks    Status Achieved    Target Date 10/18/20      PT SHORT TERM GOAL #2   Title Pt will demonstrate improved control when performing 5TSS test and perform in </=11.5 secs to indicate improved control/strength.    Baseline 10/14/20 8.53 sec without hands from mat    Period Weeks    Status Achieved      PT SHORT TERM GOAL #3   Title Pt will improve FGA to >/=23/30 in order to indicate dec fall risk.    Baseline 10/16/20 25/30    Time 4    Period Weeks    Status Achieved      PT SHORT TERM GOAL #4   Title Pt will perform putting in controlled environment at S level in order to indicate safety when golfing.    Baseline Pt was able to perform putting and swinging golf club independently in grass.    Time 4    Period Weeks    Status Achieved      PT SHORT TERM GOAL #5   Title Pt will ambulate x 300' over varying outdoor surfaces with more normal BOS and more controlled eccentric DF to indicate safety with leisure activities.    Baseline 10/14/20 Pt ambulated outside on varied surfaces independently with improved foot placement    Time 4    Period Weeks    Status Achieved             PT Long Term Goals - 09/18/20 1240      PT LONG TERM GOAL #1   Title Pt will be IND with final HEP in order to indicate dec fall risk and improved functional mobility. (Target Date: 11/17/20)    Time 8    Period Weeks    Status New    Target Date 11/17/20      PT LONG TERM GOAL #2   Title Pt will improve FGA to >/=26/30 in order to indicate dec fall risk.    Time 8    Period Weeks    Status New      PT LONG TERM GOAL #3   Title Pt will perform golf swings outside of varied surfaces at S level  (distant) with no overt LOB and good control during "set up" in order to indicate safety with golf game.    Time 8    Period Weeks    Status New      PT LONG TERM GOAL #4   Title Pt will ambulate >1000' over varying surfaces at mod I level without overt LOB/gait compensations in order to indicate safety with leisure activities.    Time 8    Period Weeks    Status New             10/23/20 0721  Plan  Clinical Impression Statement Today's skilled session continued to focus on dynamic gait on varied surfaces and balance  training on compliant surfaces. Pt significantly challenged with vision removed. No issues noted or reported in session. The pt is progressing toward goals and should benefit from continued PT to progress toward unmet goals.  Personal Factors and Comorbidities Comorbidity 3+;Time since onset of injury/illness/exacerbation  Comorbidities see above  Examination-Activity Limitations Lift;Squat;Locomotion Level;Stairs;Stand  Examination-Participation Restrictions Community Activity;Yard Work;Shop  Pt will benefit from skilled therapeutic intervention in order to improve on the following deficits Abnormal gait;Decreased activity tolerance;Decreased balance;Decreased coordination;Decreased endurance;Decreased mobility;Impaired UE functional use;Postural dysfunction  Stability/Clinical Decision Making Evolving/Moderate complexity  Rehab Potential Good  PT Frequency 2x / week  PT Duration 8 weeks  PT Treatment/Interventions ADLs/Self Care Home Management;Aquatic Therapy;DME Instruction;Gait training;Stair training;Functional mobility training;Therapeutic activities;Therapeutic exercise;Balance training;Neuromuscular re-education;Patient/family education;Visual/perceptual remediation/compensation;Vestibular  PT Next Visit Plan Continue high level balance, standing on compliant surfaces, work on slow controlled movements, wants to work on golf!! Be sure to have him engage core with  activities.  PT Home Exercise Plan Access Code: [email or web address]  Consulted and Agree with Plan of Care Patient          Patient will benefit from skilled therapeutic intervention in order to improve the following deficits and impairments:  Abnormal gait,Decreased activity tolerance,Decreased balance,Decreased coordination,Decreased endurance,Decreased mobility,Impaired UE functional use,Postural dysfunction  Visit Diagnosis: Other abnormalities of gait and mobility  Unsteadiness on feet  Dizziness and giddiness     Problem List Patient Active Problem List   Diagnosis Date Noted  . Benign prostatic hyperplasia with urinary obstruction 07/30/2020  . Incomplete emptying of bladder 07/30/2020  . Essential hypertension 11/08/2014  . Hepatitis C 11/08/2014    Willow Ora, PTA, [facility] [address], [place], [place] [address] [phone] 10/24/20, 5:24 PM   Name: TEJAS SEAWOOD MRN: [id number] Date of Birth: Oct 31, 1955

## 2020-10-27 ENCOUNTER — Other Ambulatory Visit: Payer: Self-pay

## 2020-10-27 ENCOUNTER — Encounter: Payer: Self-pay | Admitting: Rehabilitation

## 2020-10-27 ENCOUNTER — Ambulatory Visit: Payer: Medicare Other | Admitting: Rehabilitation

## 2020-10-27 DIAGNOSIS — R2689 Other abnormalities of gait and mobility: Secondary | ICD-10-CM | POA: Diagnosis not present

## 2020-10-27 DIAGNOSIS — R2681 Unsteadiness on feet: Secondary | ICD-10-CM

## 2020-10-27 DIAGNOSIS — R278 Other lack of coordination: Secondary | ICD-10-CM

## 2020-10-27 DIAGNOSIS — R42 Dizziness and giddiness: Secondary | ICD-10-CM | POA: Diagnosis not present

## 2020-10-27 NOTE — Therapy (Signed)
[facility] [address] [place], [place], [address] Phone: [phone]   Fax:  [phone]  Physical Therapy Treatment and Progress Note  Patient Details  Name: Joseph Duffy MRN: [id number] Date of Birth: March 23, 1956 Referring Provider (PT): William Dalton, MD   Encounter Date: 10/27/2020   PT End of Session - 10/27/20 1225    Visit Number 10    Number of Visits 17    Date for PT Re-Evaluation 11/17/20    Authorization Type [employer] Medicare (10th visit PN needed)    Progress Note Due on Visit 20    PT Start Time 0933    PT Stop Time 1015    PT Time Calculation (min) 42 min    Equipment Utilized During Treatment Gait belt    Activity Tolerance Patient tolerated treatment well;No increased pain    Behavior During Therapy WFL for tasks assessed/performed           Past Medical History:  Diagnosis Date  . Ataxia   . Bell's palsy 12/2019   right side  . Cirrhosis of liver ([place])   . Hepatitis C   . Hypertension   . 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 No pertinent surgical history.  There were no vitals filed for this visit.   Subjective Assessment - 10/27/20 0941    Subjective Reports had a great weekend golfing both Friday and Saturday.  Had no issues with balance or dizziness.    Pertinent History Bells Palsy, HepC, HTN, sunlight sensitivity    Patient Stated Goals "I want to stop this swaying."    Currently in Pain? No/denies                             [facility] Adult PT Treatment/Exercise - 10/27/20 1004      Self-Care   Other Self-Care Comments  Pt reports he feels he is doing so much better esp over the weekend golfing, but today feels "kind of back to same old."  Educated that he still may have good days and bad and that doing marked increase in activity will cause level of fatigue to be higher the next day or so.  Pt verbalized understanding, but overall he is  showing great improvement.      Neuro Re-ed    Neuro Re-ed Details  High level balance assessment with MCTSIB in which pt about to stand x 30 secs with minimal sway on all conditions except for 4th condition making it 22 secs before needing to step for support, Continued work on foam balance beam tandem walking with lateral cone taps intermittently x 4 laps down and back with intermittent UE support as needed, cues for core activation.  Practiced "setting up" and swinging golf club while in stance on foam beam x 2 sets of 5 reps.  No overt LOB but tendency to shift weight forward.  Perpendicular on beam tapping R LE anteirorly and laterally x 10 reps with max cues and mod A at times for adequate L lateral weight shift.  Repeated vice versa x 10 reps.  Ended with Quadruped birddog x 5 reps on each side with 5 sec hold.  He reports he is doing this at home but without hold and is easy.  Educated to exhale to active core and move slowly and hold x 5 secs each rep.  Pt verbalized understanding.  PT Short Term Goals - 10/16/20 0945      PT SHORT TERM GOAL #1   Title Pt will be IND with initial HEP in order to indicate dec fall risk and improved functional mobility. (Target Date: 10/18/20)    Baseline 10/16/20 Pt has been performing HEP as instructed. Reviewed today and did well.    Time 4    Period Weeks    Status Achieved    Target Date 10/18/20      PT SHORT TERM GOAL #2   Title Pt will demonstrate improved control when performing 5TSS test and perform in </=11.5 secs to indicate improved control/strength.    Baseline 10/14/20 8.53 sec without hands from mat    Period Weeks    Status Achieved      PT SHORT TERM GOAL #3   Title Pt will improve FGA to >/=23/30 in order to indicate dec fall risk.    Baseline 10/16/20 25/30    Time 4    Period Weeks    Status Achieved      PT SHORT TERM GOAL #4   Title Pt will perform putting in controlled environment at S level in order  to indicate safety when golfing.    Baseline Pt was able to perform putting and swinging golf club independently in grass.    Time 4    Period Weeks    Status Achieved      PT SHORT TERM GOAL #5   Title Pt will ambulate x 300' over varying outdoor surfaces with more normal BOS and more controlled eccentric DF to indicate safety with leisure activities.    Baseline 10/14/20 Pt ambulated outside on varied surfaces independently with improved foot placement    Time 4    Period Weeks    Status Achieved             PT Long Term Goals - 09/18/20 1240      PT LONG TERM GOAL #1   Title Pt will be IND with final HEP in order to indicate dec fall risk and improved functional mobility. (Target Date: 11/17/20)    Time 8    Period Weeks    Status New    Target Date 11/17/20      PT LONG TERM GOAL #2   Title Pt will improve FGA to >/=26/30 in order to indicate dec fall risk.    Time 8    Period Weeks    Status New      PT LONG TERM GOAL #3   Title Pt will perform golf swings outside of varied surfaces at S level (distant) with no overt LOB and good control during "set up" in order to indicate safety with golf game.    Time 8    Period Weeks    Status New      PT LONG TERM GOAL #4   Title Pt will ambulate >1000' over varying surfaces at mod I level without overt LOB/gait compensations in order to indicate safety with leisure activities.    Time 8    Period Weeks    Status New              Progress Note Reporting Period 09/18/20 to 10/27/20  See note below for Objective Data and Assessment of Progress/Goals.         Plan - 10/27/20 1226    Clinical Impression Statement Skilled session contiued to focus on high level balance on compliant surfaces esp in SLS and incorporating  golfing challenge as well.  Pt overall showing improvement, esp with double leg stance on compliant surfaces, but continues to have difficulty with SLS on compliant surface.    Personal Factors and  Comorbidities Comorbidity 3+;Time since onset of injury/illness/exacerbation    Comorbidities see above    Examination-Activity Limitations Lift;Squat;Locomotion Level;Stairs;Stand    Examination-Participation Restrictions Community Activity;Yard Work;Shop    Stability/Clinical Decision Making Evolving/Moderate complexity    Rehab Potential Good    PT Frequency 2x / week    PT Duration 8 weeks    PT Treatment/Interventions ADLs/Self Care Home Management;Aquatic Therapy;DME Instruction;Gait training;Stair training;Functional mobility training;Therapeutic activities;Therapeutic exercise;Balance training;Neuromuscular re-education;Patient/family education;Visual/perceptual remediation/compensation;Vestibular    PT Next Visit Plan Continue high level balance, standing on compliant surfaces, work on slow controlled movements, wants to work on golf!! Be sure to have him engage core with activities.    PT Home Exercise Plan Access Code: [email or web address]    Consulted and Agree with Plan of Care Patient           Patient will benefit from skilled therapeutic intervention in order to improve the following deficits and impairments:  Abnormal gait,Decreased activity tolerance,Decreased balance,Decreased coordination,Decreased endurance,Decreased mobility,Impaired UE functional use,Postural dysfunction  Visit Diagnosis: Other abnormalities of gait and mobility  Unsteadiness on feet  Dizziness and giddiness  Other lack of coordination     Problem List Patient Active Problem List   Diagnosis Date Noted  . Benign prostatic hyperplasia with urinary obstruction 07/30/2020  . Incomplete emptying of bladder 07/30/2020  . Essential hypertension 11/08/2014  . Hepatitis C 11/08/2014    Cameron Sprang, PT, MPT [facility] [address] [place], [place], [address] Phone: [phone]   Fax:  [phone] 10/27/20, 12:28 PM  Name: COE ANGELOS MRN:  [id number] Date of Birth: 13-Mar-1956

## 2020-10-29 ENCOUNTER — Ambulatory Visit: Payer: Medicare Other | Admitting: Rehabilitation

## 2020-10-31 ENCOUNTER — Other Ambulatory Visit: Payer: Medicare Other | Admitting: Urology

## 2020-11-03 ENCOUNTER — Other Ambulatory Visit: Payer: Medicare Other | Admitting: Urology

## 2020-11-04 ENCOUNTER — Ambulatory Visit: Payer: Medicare Other

## 2020-11-06 ENCOUNTER — Other Ambulatory Visit: Payer: Self-pay

## 2020-11-06 ENCOUNTER — Encounter: Payer: Self-pay | Admitting: Physical Therapy

## 2020-11-06 ENCOUNTER — Ambulatory Visit: Payer: Medicare Other | Admitting: Physical Therapy

## 2020-11-06 DIAGNOSIS — R42 Dizziness and giddiness: Secondary | ICD-10-CM | POA: Diagnosis not present

## 2020-11-06 DIAGNOSIS — R2689 Other abnormalities of gait and mobility: Secondary | ICD-10-CM | POA: Diagnosis not present

## 2020-11-06 DIAGNOSIS — R2681 Unsteadiness on feet: Secondary | ICD-10-CM

## 2020-11-06 DIAGNOSIS — R278 Other lack of coordination: Secondary | ICD-10-CM | POA: Diagnosis not present

## 2020-11-06 NOTE — Patient Instructions (Signed)
Access Code: [email or web address] URL: [email or web address] Date: 11/06/2020 Prepared by: Willow Ora  Exercises Walking March - 1 x daily - 5 x weekly - 1 sets - 3 reps Tandem Walking with Counter Support - 1 x daily - 5 x weekly - 1 sets - 3 reps Wide Tandem Stance on Foam Pad with Eyes Closed - 1 x daily - 5 x weekly - 1 sets - 3 reps - 30 hold Romberg Stance Eyes Closed on Foam Pad - 1 x daily - 5 x weekly - 1 sets - 3 reps - 30 hold Wide Stance with Eyes Closed and Head Nods on Foam Pad - 1 x daily - 5 x weekly - 1 sets - 10 reps

## 2020-11-08 NOTE — Therapy (Signed)
[facility] [address] [place], [place], [address] Phone: [phone]   Fax:  [phone]  Physical Therapy Treatment  Patient Details  Name: Joseph Duffy MRN: [id number] Date of Birth: April 22, 1956 Referring Provider (PT): William Dalton, MD   Encounter Date: 11/06/2020     11/06/20 0938  PT Visits / Re-Eval  Visit Number 11  Number of Visits 17  Date for PT Re-Evaluation 11/17/20  Authorization  Authorization Type [employer] Medicare (10th visit PN needed)  Progress Note Due on Visit 20  PT Time Calculation  PT Start Time 0933  PT Stop Time 1015  PT Time Calculation (min) 42 min  PT - End of Session  Equipment Utilized During Treatment Gait belt  Activity Tolerance Patient tolerated treatment well;No increased pain  Behavior During Therapy WFL for tasks assessed/performed    Past Medical History:  Diagnosis Date  . Ataxia   . Bell's palsy 12/2019   right side  . Cirrhosis of liver ([place])   . Hepatitis C   . Hypertension   . 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 No pertinent surgical history.  There were no vitals filed for this visit.      11/06/20 0936  Symptoms/Limitations  Subjective No new complaitns. No falls or pain to report. Still gets dizziness in the evenings 4 of 7 days. Also has "really good days" as well. Pt also reports not being able to do the ex's due many other things going on and want to narrow it down to what is best to do as he has so many to do.  Pertinent History Bells Palsy, HepC, HTN, sunlight sensitivity  Limitations Walking  How long can you walk comfortably? approx 30-45 mins  Patient Stated Goals "I want to stop this swaying."  Pain Assessment  Currently in Pain? No/denies  Pain Score 0       11/06/20 0940  Transfers  Transfers Sit to Stand;Stand to Sit  Sit to Stand 6: Modified independent (Device/Increase time)  Stand to Sit 6: Modified  independent (Device/Increase time)  Ambulation/Gait  Ambulation/Gait Yes  Ambulation/Gait Assistance 5: Supervision;4: Min guard  Ambulation/Gait Assistance Details gait outdoors on paved surfaces and large grassy hill at back of building with min guard assist, otherwise supervision. no overt loss of balance noted, minor veering due to ataxia  Ambulation Distance (Feet) 600 Feet (x1, plus around gym with session)  Assistive device None  Gait Pattern Step-through pattern;Ataxic  Ambulation Surface Level;Unlevel;Indoor;Paved;Outdoor;Grass  Curb 5: Supervision  Curb Details (indicate cue type and reason) x1 on outdoor curb  Neuro Re-ed   Neuro Re-ed Details  for balance/coordination/NMR: on steep grassy hill working on swinging golf club facing up/down hill, then moving to a steeper hill facing both directions for several swings each position with min guard assist. no balance loss noted; reviewed current HEP and modified it down to just a few ex's to do at home- avanced to higher level ex's as well. Refer to [facility] for full details. No issues noted or reported in session. Min guard assist for safety.     Issued to HEP today:  Access Code: [email or web address] URL: [email or web address] Date: 11/06/2020 Prepared by: Willow Ora  Exercises Walking March - 1 x daily - 5 x weekly - 1 sets - 3 reps Tandem Walking with Counter Support - 1 x daily - 5 x weekly - 1 sets - 3 reps Wide Tandem Stance on Foam Pad with Eyes Closed -  1 x daily - 5 x weekly - 1 sets - 3 reps - 30 hold Romberg Stance Eyes Closed on Foam Pad - 1 x daily - 5 x weekly - 1 sets - 3 reps - 30 hold Wide Stance with Eyes Closed and Head Nods on Foam Pad - 1 x daily - 5 x weekly - 1 sets - 10 reps      11/06/20 1921  PT Education  Education Details advanced/consolidated HEP for balance  Person(s) Educated Patient  Methods Explanation;Demonstration;Verbal cues;Handout  Comprehension Verbalized understanding;Returned  demonstration;Verbal cues required;Tactile cues required          PT Short Term Goals - 10/16/20 0945      PT SHORT TERM GOAL #1   Title Pt will be IND with initial HEP in order to indicate dec fall risk and improved functional mobility. (Target Date: 10/18/20)    Baseline 10/16/20 Pt has been performing HEP as instructed. Reviewed today and did well.    Time 4    Period Weeks    Status Achieved    Target Date 10/18/20      PT SHORT TERM GOAL #2   Title Pt will demonstrate improved control when performing 5TSS test and perform in </=11.5 secs to indicate improved control/strength.    Baseline 10/14/20 8.53 sec without hands from mat    Period Weeks    Status Achieved      PT SHORT TERM GOAL #3   Title Pt will improve FGA to >/=23/30 in order to indicate dec fall risk.    Baseline 10/16/20 25/30    Time 4    Period Weeks    Status Achieved      PT SHORT TERM GOAL #4   Title Pt will perform putting in controlled environment at S level in order to indicate safety when golfing.    Baseline Pt was able to perform putting and swinging golf club independently in grass.    Time 4    Period Weeks    Status Achieved      PT SHORT TERM GOAL #5   Title Pt will ambulate x 300' over varying outdoor surfaces with more normal BOS and more controlled eccentric DF to indicate safety with leisure activities.    Baseline 10/14/20 Pt ambulated outside on varied surfaces independently with improved foot placement    Time 4    Period Weeks    Status Achieved             PT Long Term Goals - 09/18/20 1240      PT LONG TERM GOAL #1   Title Pt will be IND with final HEP in order to indicate dec fall risk and improved functional mobility. (Target Date: 11/17/20)    Time 8    Period Weeks    Status New    Target Date 11/17/20      PT LONG TERM GOAL #2   Title Pt will improve FGA to >/=26/30 in order to indicate dec fall risk.    Time 8    Period Weeks    Status New      PT LONG TERM GOAL #3    Title Pt will perform golf swings outside of varied surfaces at S level (distant) with no overt LOB and good control during "set up" in order to indicate safety with golf game.    Time 8    Period Weeks    Status New      PT LONG TERM  GOAL #4   Title Pt will ambulate >1000' over varying surfaces at mod I level without overt LOB/gait compensations in order to indicate safety with leisure activities.    Time 8    Period Weeks    Status New             11/06/20 8416  Plan  Clinical Impression Statement Today's skilled session continued to focus on balance on compliant surfaces with golf simulation with no issues noted. Remainded of session focused on consolidation and advancement of pt's HEP. No issues noted or reported with advanced ex's when performed in session. The pt is progressing toward goals and should benefit from continued PT to progress toward unmet goals.  Personal Factors and Comorbidities Comorbidity 3+;Time since onset of injury/illness/exacerbation  Comorbidities see above  Examination-Activity Limitations Lift;Squat;Locomotion Level;Stairs;Stand  Examination-Participation Restrictions Community Activity;Yard Work;Shop  Pt will benefit from skilled therapeutic intervention in order to improve on the following deficits Abnormal gait;Decreased activity tolerance;Decreased balance;Decreased coordination;Decreased endurance;Decreased mobility;Impaired UE functional use;Postural dysfunction  Stability/Clinical Decision Making Evolving/Moderate complexity  Rehab Potential Good  PT Frequency 2x / week  PT Duration 8 weeks  PT Treatment/Interventions ADLs/Self Care Home Management;Aquatic Therapy;DME Instruction;Gait training;Stair training;Functional mobility training;Therapeutic activities;Therapeutic exercise;Balance training;Neuromuscular re-education;Patient/family education;Visual/perceptual remediation/compensation;Vestibular  PT Next Visit Plan Continue high level balance,  standing on compliant surfaces, work on slow controlled movements, wants to work on golf!! Be sure to have him engage core with activities.  PT Home Exercise Plan Access Code: [email or web address]- consolidated/advanced on 11/06/20- pt was reporting having too many ex's to do at home and wanted to have just a few to focus on. Keep this in mind if adding anything.  Consulted and Agree with Plan of Care Patient          Patient will benefit from skilled therapeutic intervention in order to improve the following deficits and impairments:  Abnormal gait,Decreased activity tolerance,Decreased balance,Decreased coordination,Decreased endurance,Decreased mobility,Impaired UE functional use,Postural dysfunction  Visit Diagnosis: Other abnormalities of gait and mobility  Unsteadiness on feet  Dizziness and giddiness     Problem List Patient Active Problem List   Diagnosis Date Noted  . Benign prostatic hyperplasia with urinary obstruction 07/30/2020  . Incomplete emptying of bladder 07/30/2020  . Essential hypertension 11/08/2014  . Hepatitis C 11/08/2014    Willow Ora, PTA, [facility] Outpatient Neuro [facility] [address] [place], [place] [address] [phone] 11/08/20, 7:15 PM   Name: Joseph Duffy MRN: [id number] Date of Birth: Feb 16, 1956

## 2020-11-11 ENCOUNTER — Ambulatory Visit: Payer: Medicare Other | Attending: Psychiatry | Admitting: Physical Therapy

## 2020-11-11 ENCOUNTER — Other Ambulatory Visit: Payer: Self-pay

## 2020-11-11 ENCOUNTER — Encounter: Payer: Self-pay | Admitting: Physical Therapy

## 2020-11-11 DIAGNOSIS — R2689 Other abnormalities of gait and mobility: Secondary | ICD-10-CM | POA: Insufficient documentation

## 2020-11-11 DIAGNOSIS — R42 Dizziness and giddiness: Secondary | ICD-10-CM | POA: Insufficient documentation

## 2020-11-11 DIAGNOSIS — R2681 Unsteadiness on feet: Secondary | ICD-10-CM | POA: Diagnosis not present

## 2020-11-11 DIAGNOSIS — R278 Other lack of coordination: Secondary | ICD-10-CM | POA: Diagnosis not present

## 2020-11-11 DIAGNOSIS — M6281 Muscle weakness (generalized): Secondary | ICD-10-CM | POA: Diagnosis not present

## 2020-11-11 NOTE — Therapy (Signed)
[facility] [address] [place], [place], [address] Phone: [phone]   Fax:  [phone]  Physical Therapy Treatment  Patient Details  Name: Joseph Duffy MRN: [id number] Date of Birth: [birth year] Referring Provider (PT): William Dalton, MD   Encounter Date: 11/11/2020   PT End of Session - 11/11/20 0938    Visit Number 12    Number of Visits 17    Date for PT Re-Evaluation 11/17/20    Authorization Type [employer] Medicare (10th visit PN needed)    Progress Note Due on Visit 20    PT Start Time 0932    PT Stop Time 1011    PT Time Calculation (min) 39 min    Equipment Utilized During Treatment Gait belt    Activity Tolerance Patient tolerated treatment well;No increased pain    Behavior During Therapy WFL for tasks assessed/performed           Past Medical History:  Diagnosis Date  . Ataxia   . Bell's palsy 12/2019   right side  . Cirrhosis of liver ([place])   . Hepatitis C   . Hypertension   . 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 No pertinent surgical history.  There were no vitals filed for this visit.   Subjective Assessment - 11/11/20 0936    Subjective No new complaints. No falls. Has had no dizziness over the weekend/so far this week. Played golf on both days with no issues. Does feel that if he does the high knee marching first thing the the morning it gets him "set" for the day.    Pertinent History Bells Palsy, HepC, HTN, sunlight sensitivity    Limitations Walking    How long can you walk comfortably? approx 30-45 mins    Patient Stated Goals "I want to stop this swaying."    Currently in Pain? No/denies    Pain Score 0-No pain              [facility] PT Assessment - 11/11/20 0941      Functional Gait  Assessment   Gait assessed  Yes    Gait Level Surface Walks 20 ft in less than 7 sec but greater than 5.5 sec, uses assistive device, slower speed, mild gait deviations, or  deviates 6-10 in outside of the 12 in walkway width.   5.90 sec's   Change in Gait Speed Able to smoothly change walking speed without loss of balance or gait deviation. Deviate no more than 6 in outside of the 12 in walkway width.    Gait with Horizontal Head Turns Performs head turns smoothly with slight change in gait velocity (eg, minor disruption to smooth gait path), deviates 6-10 in outside 12 in walkway width, or uses an assistive device.   decreased gait speed with minor veering   Gait with Vertical Head Turns Performs head turns with no change in gait. Deviates no more than 6 in outside 12 in walkway width.    Gait and Pivot Turn Pivot turns safely within 3 sec and stops quickly with no loss of balance.    Step Over Obstacle Is able to step over 2 stacked shoe boxes taped together (9 in total height) without changing gait speed. No evidence of imbalance.    Gait with Narrow Base of Support Ambulates 4-7 steps.   3 steps, then becomes modified tandem   Gait with Eyes Closed Walks 20 ft, uses assistive device, slower speed, mild  gait deviations, deviates 6-10 in outside 12 in walkway width. Ambulates 20 ft in less than 9 sec but greater than 7 sec.   drifts >3 feet out of pathway toward left side, 7.56 sec's   Ambulating Backwards Walks 20 ft, uses assistive device, slower speed, mild gait deviations, deviates 6-10 in outside 12 in walkway width.   slow gait speed, no balance loss   Steps Alternating feet, no rail.    Total Score 24    FGA comment: 19-24 = medium risk fall                [facility] Adult PT Treatment/Exercise - 11/11/20 0941      Transfers   Transfers Sit to Stand;Stand to Sit    Sit to Stand 6: Modified independent (Device/Increase time)    Stand to Sit 6: Modified independent (Device/Increase time)      Ambulation/Gait   Ambulation/Gait Yes    Ambulation/Gait Assistance 5: Supervision    Ambulation/Gait Assistance Details minor veering at times with no overt loss  of balance    Ambulation Distance (Feet) 1000 Feet    Assistive device None    Gait Pattern Step-through pattern;Ataxic    Ambulation Surface Level;Unlevel;Indoor;Outdoor;Paved;Grass      Neuro Re-ed    Neuro Re-ed Details  for balance/coordination: golf swings on steep grassy hill at back of building facing up and down hill with distant supervision. then on steeper grass hill by pond for both ways with min gaurd assist for balance. cues on stance and weight shifting to assist balance as club moves across in swing motion.                PT Short Term Goals - 10/16/20 0945      PT SHORT TERM GOAL #1   Title Pt will be IND with initial HEP in order to indicate dec fall risk and improved functional mobility. (Target Date: 10/18/20)    Baseline 10/16/20 Pt has been performing HEP as instructed. Reviewed today and did well.    Time 4    Period Weeks    Status Achieved    Target Date 10/18/20      PT SHORT TERM GOAL #2   Title Pt will demonstrate improved control when performing 5TSS test and perform in </=11.5 secs to indicate improved control/strength.    Baseline 10/14/20 8.53 sec without hands from mat    Period Weeks    Status Achieved      PT SHORT TERM GOAL #3   Title Pt will improve FGA to >/=23/30 in order to indicate dec fall risk.    Baseline 10/16/20 25/30    Time 4    Period Weeks    Status Achieved      PT SHORT TERM GOAL #4   Title Pt will perform putting in controlled environment at S level in order to indicate safety when golfing.    Baseline Pt was able to perform putting and swinging golf club independently in grass.    Time 4    Period Weeks    Status Achieved      PT SHORT TERM GOAL #5   Title Pt will ambulate x 300' over varying outdoor surfaces with more normal BOS and more controlled eccentric DF to indicate safety with leisure activities.    Baseline 10/14/20 Pt ambulated outside on varied surfaces independently with improved foot placement    Time 4     Period Weeks    Status Achieved  PT Long Term Goals - 11/11/20 0940      PT LONG TERM GOAL #1   Title Pt will be IND with final HEP in order to indicate dec fall risk and improved functional mobility. (Target Date: 11/17/20)    Time 8    Period Weeks    Status On-going      PT LONG TERM GOAL #2   Title Pt will improve FGA to >/=26/30 in order to indicate dec fall risk.    Baseline 11/11/20: 24/30 scored today, improved just not to goal level    Time --    Period --    Status Partially Met      PT LONG TERM GOAL #3   Title Pt will perform golf swings outside of varied surfaces at S level (distant) with no overt LOB and good control during "set up" in order to indicate safety with golf game.    Baseline 11/11/20: met with session today. Pt has also returned to full golf games    Time --    Period --    Status Achieved      PT LONG TERM GOAL #4   Title Pt will ambulate >1000' over varying surfaces at mod I level without overt LOB/gait compensations in order to indicate safety with leisure activities.    Baseline 11/11/20: supervision level for distance over varied surfaces, improved from min guard to min assist    Time --    Period --    Status Partially Met                 Plan - 11/11/20 0938    Clinical Impression Statement Today's skilled session began to address progress toward LTGs with pt's golf goal met and FGA/outdoor gait goals partially met. Will plan to check remaining goals at next session for recert vs discharge.    Personal Factors and Comorbidities Comorbidity 3+;Time since onset of injury/illness/exacerbation    Comorbidities see above    Examination-Activity Limitations Lift;Squat;Locomotion Level;Stairs;Stand    Examination-Participation Restrictions Community Activity;Yard Work;Shop    Stability/Clinical Decision Making Evolving/Moderate complexity    Rehab Potential Good    PT Frequency 2x / week    PT Duration 8 weeks    PT  Treatment/Interventions ADLs/Self Care Home Management;Aquatic Therapy;DME Instruction;Gait training;Stair training;Functional mobility training;Therapeutic activities;Therapeutic exercise;Balance training;Neuromuscular re-education;Patient/family education;Visual/perceptual remediation/compensation;Vestibular    PT Next Visit Plan check remaining LTGs for recert vs discharge    PT Home Exercise Plan Access Code: [email or web address]- consolidated/advanced on 11/06/20- pt was reporting having too many ex's to do at home and wanted to have just a few to focus on. Keep this in mind if adding anything.    Consulted and Agree with Plan of Care Patient           Patient will benefit from skilled therapeutic intervention in order to improve the following deficits and impairments:  Abnormal gait,Decreased activity tolerance,Decreased balance,Decreased coordination,Decreased endurance,Decreased mobility,Impaired UE functional use,Postural dysfunction  Visit Diagnosis: Other abnormalities of gait and mobility  Unsteadiness on feet  Dizziness and giddiness     Problem List Patient Active Problem List   Diagnosis Date Noted  . Benign prostatic hyperplasia with urinary obstruction 07/30/2020  . Incomplete emptying of bladder 07/30/2020  . Essential hypertension 11/08/2014  . Hepatitis C 11/08/2014    Willow Ora, PTA, [facility] Outpatient Neuro [facility] [place] [address] [place], [place] [address] [phone] 11/11/20, 2:27 PM   Name: Joseph Duffy MRN: [id number] Date of Birth:  12/17/1955   

## 2020-11-13 ENCOUNTER — Ambulatory Visit: Payer: Medicare Other

## 2020-11-13 ENCOUNTER — Other Ambulatory Visit: Payer: Self-pay

## 2020-11-13 DIAGNOSIS — R278 Other lack of coordination: Secondary | ICD-10-CM | POA: Diagnosis not present

## 2020-11-13 DIAGNOSIS — R2689 Other abnormalities of gait and mobility: Secondary | ICD-10-CM | POA: Diagnosis not present

## 2020-11-13 DIAGNOSIS — R42 Dizziness and giddiness: Secondary | ICD-10-CM | POA: Diagnosis not present

## 2020-11-13 DIAGNOSIS — R2681 Unsteadiness on feet: Secondary | ICD-10-CM | POA: Diagnosis not present

## 2020-11-13 DIAGNOSIS — M6281 Muscle weakness (generalized): Secondary | ICD-10-CM | POA: Diagnosis not present

## 2020-11-13 NOTE — Patient Instructions (Signed)
Access Code: [email or web address] URL: [email or web address] Date: 11/13/2020 Prepared by: Cherly Anderson  Exercises Walking March - 1 x daily - 5 x weekly - 1 sets - 3 reps Tandem Walking with Counter Support - 1 x daily - 5 x weekly - 1 sets - 3 reps Wide Tandem Stance on Foam Pad with Eyes Closed - 1 x daily - 5 x weekly - 1 sets - 3 reps - 30 hold Romberg Stance Eyes Closed on Foam Pad - 1 x daily - 5 x weekly - 1 sets - 3 reps - 30 hold Wide Stance with Eyes Closed and Head Nods on Foam Pad - 1 x daily - 5 x weekly - 1 sets - 10 reps

## 2020-11-14 NOTE — Therapy (Signed)
[facility] [address] [place], [place], [address] Phone: [phone]   Fax:  [phone]  Physical Therapy Treatment/Recert  Patient Details  Name: Joseph Duffy MRN: [id number] Date of Birth: January 11, 1956 Referring Provider (PT): William Dalton, MD   Encounter Date: 11/13/2020   PT End of Session - 11/13/20 0928    Visit Number 13    Number of Visits 21    Date for PT Re-Evaluation 01/13/21    Authorization Type U[employer] Medicare (10th visit PN needed)    Progress Note Due on Visit 20    PT Start Time 0928    PT Stop Time 1009    PT Time Calculation (min) 41 min    Equipment Utilized During Treatment Gait belt    Activity Tolerance Patient tolerated treatment well;No increased pain    Behavior During Therapy WFL for tasks assessed/performed           Past Medical History:  Diagnosis Date  . Ataxia   . Bell's palsy 12/2019   right side  . Cirrhosis of liver ([place])   . Hepatitis C   . Hypertension   . 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 No pertinent surgical history.  There were no vitals filed for this visit.   Subjective Assessment - 11/13/20 0929    Subjective Pt reports that he continues to see his progress. Does find that at night is when he is most wobbly. Does pretty well in morning and during the day but does have good days and bad days. He would really like to continue to work with therapy as is really seeing the benefit.    Pertinent History Bells Palsy, HepC, HTN, sunlight sensitivity    Limitations Walking    How long can you walk comfortably? approx 30-45 mins    Patient Stated Goals "I want to stop this swaying."    Currently in Pain? No/denies                             [facility] Adult PT Treatment/Exercise - 11/13/20 0932      Ambulation/Gait   Ambulation/Gait Yes    Ambulation/Gait Assistance 5: Supervision    Ambulation/Gait Assistance Details Pt  has wide BOS with quicker step on right and decreased arm swing and trunk rotation.    Ambulation Distance (Feet) 850 Feet    Assistive device None    Gait Pattern Step-through pattern;Decreased arm swing - right;Decreased arm swing - left;Decreased trunk rotation;Ataxic    Ambulation Surface Level;Unlevel;Indoor;Outdoor;Paved;Grass    Gait velocity 9.19 sec=1.08 m/s      Neuro Re-ed    Neuro Re-ed Details  PT performed 3rd and 4th condition of MCTSIB to assess. Pt steady and able to hold 30 sec on condition 3, condition 4 held for 30 sec but increased sway/ataxia.      Exercises   Exercises Other Exercises    Other Exercises  Reviewed exercises at noted below from [place].            Exercises- performed along counter and in corner with verbal cues for slow, controlled movements and to engage core. Improved with practice. Walking March - 1 x daily - 5 x weekly - 1 sets - 3 reps Tandem Walking with Counter Support - 1 x daily - 5 x weekly - 1 sets - 3 reps Wide Tandem Stance on Foam Pad with Eyes Closed -  1 x daily - 5 x weekly - 1 sets - 3 reps - 30 hold Romberg Stance Eyes Closed on Foam Pad - 1 x daily - 5 x weekly - 1 sets - 3 reps - 30 hold Wide Stance with Eyes Closed and Head Nods on Foam Pad - 1 x daily - 5 x weekly - 1 sets - 10 reps       PT Education - 11/14/20 1550    Education Details Discussed recert plan for 2w2, 1w4 to continue to work on balance    Person(s) Educated Patient    Methods Explanation    Comprehension Verbalized understanding            PT Short Term Goals - 10/16/20 0945      PT SHORT TERM GOAL #1   Title Pt will be IND with initial HEP in order to indicate dec fall risk and improved functional mobility. (Target Date: 10/18/20)    Baseline 10/16/20 Pt has been performing HEP as instructed. Reviewed today and did well.    Time 4    Period Weeks    Status Achieved    Target Date 10/18/20      PT SHORT TERM GOAL #2   Title Pt will  demonstrate improved control when performing 5TSS test and perform in </=11.5 secs to indicate improved control/strength.    Baseline 10/14/20 8.53 sec without hands from mat    Period Weeks    Status Achieved      PT SHORT TERM GOAL #3   Title Pt will improve FGA to >/=23/30 in order to indicate dec fall risk.    Baseline 10/16/20 25/30    Time 4    Period Weeks    Status Achieved      PT SHORT TERM GOAL #4   Title Pt will perform putting in controlled environment at S level in order to indicate safety when golfing.    Baseline Pt was able to perform putting and swinging golf club independently in grass.    Time 4    Period Weeks    Status Achieved      PT SHORT TERM GOAL #5   Title Pt will ambulate x 300' over varying outdoor surfaces with more normal BOS and more controlled eccentric DF to indicate safety with leisure activities.    Baseline 10/14/20 Pt ambulated outside on varied surfaces independently with improved foot placement    Time 4    Period Weeks    Status Achieved             PT Long Term Goals - 11/14/20 1551      PT LONG TERM GOAL #1   Title Pt will be IND with final HEP in order to indicate dec fall risk and improved functional mobility. (Target Date: 11/17/20)    Baseline Pt performing current HEP. PT continues to update. 11/13/20    Time 8    Period Weeks    Status On-going      PT LONG TERM GOAL #2   Title Pt will improve FGA to >/=26/30 in order to indicate dec fall risk.    Baseline 11/11/20: 24/30 scored today, improved just not to goal level    Status Partially Met      PT LONG TERM GOAL #3   Title Pt will perform golf swings outside of varied surfaces at S level (distant) with no overt LOB and good control during "set up" in order to indicate safety with golf  game.    Baseline 11/11/20: met with session today. Pt has also returned to full golf games    Status Achieved      PT LONG TERM GOAL #4   Title Pt will ambulate >1000' over varying surfaces at mod  I level without overt LOB/gait compensations in order to indicate safety with leisure activities.    Baseline 11/11/20: supervision level for distance over varied surfaces, improved from min guard to min assist    Status Partially Met           Updated PT goals:  PT Short Term Goals - 11/14/20 1600      PT SHORT TERM GOAL #1   Title Pt will be able to maintain condition 4 of MCSIB with decreased sway and improved stability not needing close supervision for improved stability.    Time 3    Period Weeks    Status New    Target Date 12/05/20           PT Long Term Goals - 11/14/20 1601      PT LONG TERM GOAL #1   Title Pt will be IND with final HEP in order to indicate dec fall risk and improved functional mobility. (Target Date: 11/17/20)    Time 6    Period Weeks    Status On-going    Target Date 12/26/20      PT LONG TERM GOAL #2   Title Pt will improve FGA to >/=26/30 in order to indicate dec fall risk.    Baseline 11/11/20: 24/30 scored    Time 6    Period Weeks    Status On-going    Target Date 12/26/20      PT LONG TERM GOAL #3   Title Pt will ambulate >1000' over varying surfaces at mod I level without overt LOB/gait compensations in order to indicate safety with leisure activities.    Time 6    Period Weeks    Status On-going    Target Date 12/26/20                Plan - 11/14/20 1553    Clinical Impression Statement Pt continues to show progress towards goals. He met golf goal which was very important to him. Pt still moderate fall risk based on FGA score of 24/30. He has shown improvements in balance especially when slows down and engages core more to help decrease his ataxic movements. PT continues to add to HEP and update at this time. Pt is ambulating mod I on level surfaces and supervision on nonlevel.  Pt will benefit from continued PT to continue to progress balance to maximize function and independence.    Personal Factors and Comorbidities Comorbidity  3+;Time since onset of injury/illness/exacerbation    Comorbidities see above    Examination-Activity Limitations Lift;Squat;Locomotion Level;Stairs;Stand    Examination-Participation Restrictions Community Activity;Yard Work;Shop    Stability/Clinical Decision Making Evolving/Moderate complexity    Rehab Potential Good    PT Frequency 2x / week   followed by 1x/week for 4 weeks.   PT Duration 2 weeks    PT Treatment/Interventions ADLs/Self Care Home Management;Aquatic Therapy;DME Instruction;Gait training;Stair training;Functional mobility training;Therapeutic activities;Therapeutic exercise;Balance training;Neuromuscular re-education;Patient/family education;Visual/perceptual remediation/compensation;Vestibular    PT Next Visit Plan Continues to work on balance more on compliant surfaces with focus on engaging core and slowing down movements with dynamic tasks, gait over and around obstacles.    PT Home Exercise Plan Access Code: [email or web address] consolidated/advanced on 11/06/20- pt was reporting  having too many ex's to do at home and wanted to have just a few to focus on. Keep this in mind if adding anything.    Consulted and Agree with Plan of Care Patient           Patient will benefit from skilled therapeutic intervention in order to improve the following deficits and impairments:  Abnormal gait,Decreased activity tolerance,Decreased balance,Decreased coordination,Decreased endurance,Decreased mobility,Impaired UE functional use,Postural dysfunction  Visit Diagnosis: Other abnormalities of gait and mobility  Muscle weakness (generalized)  Other lack of coordination  Unsteadiness on feet     Problem List Patient Active Problem List   Diagnosis Date Noted  . Benign prostatic hyperplasia with urinary obstruction 07/30/2020  . Incomplete emptying of bladder 07/30/2020  . Essential hypertension 11/08/2014  . Hepatitis C 11/08/2014    Electa Sniff, PT, DPT, NCS 11/14/2020, 3:57  PM  [facility] [address] [place], [place], [address] Phone: [phone]   Fax:  [phone]  Name: Joseph Duffy MRN: [id number] Date of Birth: 1955-10-07

## 2020-11-18 ENCOUNTER — Ambulatory Visit: Payer: Medicare Other

## 2020-11-18 DIAGNOSIS — D122 Benign neoplasm of ascending colon: Secondary | ICD-10-CM | POA: Diagnosis not present

## 2020-11-18 DIAGNOSIS — Z86010 Personal history of colonic polyps: Secondary | ICD-10-CM | POA: Diagnosis not present

## 2020-11-18 DIAGNOSIS — K5730 Diverticulosis of large intestine without perforation or abscess without bleeding: Secondary | ICD-10-CM | POA: Diagnosis not present

## 2020-11-18 DIAGNOSIS — Z1211 Encounter for screening for malignant neoplasm of colon: Secondary | ICD-10-CM | POA: Diagnosis not present

## 2020-11-18 DIAGNOSIS — K635 Polyp of colon: Secondary | ICD-10-CM | POA: Diagnosis not present

## 2020-11-20 ENCOUNTER — Ambulatory Visit: Payer: Medicare Other

## 2020-12-01 ENCOUNTER — Telehealth: Payer: Self-pay

## 2020-12-01 ENCOUNTER — Ambulatory Visit: Payer: Medicare Other

## 2020-12-01 NOTE — Telephone Encounter (Signed)
PT called pt to check on him as is 2nd no show. Left message. Let him know next visit was scheduled for this Thursday and to call if he could not make it. Cherly Anderson, PT, DPT, NCS

## 2020-12-04 ENCOUNTER — Ambulatory Visit: Payer: Medicare Other

## 2020-12-04 ENCOUNTER — Other Ambulatory Visit: Payer: Self-pay

## 2020-12-04 DIAGNOSIS — R2689 Other abnormalities of gait and mobility: Secondary | ICD-10-CM

## 2020-12-04 DIAGNOSIS — R2681 Unsteadiness on feet: Secondary | ICD-10-CM

## 2020-12-04 DIAGNOSIS — R42 Dizziness and giddiness: Secondary | ICD-10-CM | POA: Diagnosis not present

## 2020-12-04 DIAGNOSIS — R278 Other lack of coordination: Secondary | ICD-10-CM | POA: Diagnosis not present

## 2020-12-04 DIAGNOSIS — M6281 Muscle weakness (generalized): Secondary | ICD-10-CM | POA: Diagnosis not present

## 2020-12-04 NOTE — Patient Instructions (Signed)
Access Code: [email or web address] URL: [email or web address] Date: 12/04/2020 Prepared by: Cherly Anderson  Exercises Walking March - 1 x daily - 5 x weekly - 1 sets - 3 reps Tandem Walking with Counter Support - 1 x daily - 5 x weekly - 1 sets - 3 reps Wide Tandem Stance on Foam Pad with Eyes Closed - 1 x daily - 5 x weekly - 1 sets - 3 reps - 30 hold Romberg Stance Eyes Closed on Foam Pad - 1 x daily - 5 x weekly - 1 sets - 3 reps - 30 hold Wide Stance with Eyes Closed and Head Nods on Foam Pad - 1 x daily - 5 x weekly - 1 sets - 10 reps Thomas Stretch on Table - 2-3 x daily - 7 x weekly - 1 sets - 3 reps - 30 sec hold Standing Hip Flexor Stretch - 2-3 x daily - 7 x weekly - 1 sets - 3 reps - 30 sec hold

## 2020-12-04 NOTE — Therapy (Signed)
[facility] [address] [place], [place], [address] Phone: [phone]   Fax:  [phone]  Physical Therapy Treatment  Patient Details  Name: Joseph Duffy MRN: [id number] Date of Birth: Dec 09, 1955 Referring Provider (PT): William Dalton, MD   Encounter Date: 12/04/2020   PT End of Session - 12/04/20 0803    Visit Number 14    Number of Visits 21    Date for PT Re-Evaluation 01/13/21    Authorization Type UHC Medicare (10th visit PN needed)    Progress Note Due on Visit 20    PT Start Time 0802    PT Stop Time 0846    PT Time Calculation (min) 44 min    Equipment Utilized During Treatment Gait belt    Activity Tolerance Patient tolerated treatment well;No increased pain    Behavior During Therapy WFL for tasks assessed/performed           Past Medical History:  Diagnosis Date  . Ataxia   . Bell's palsy 12/2019   right side  . Cirrhosis of liver ([place])   . Hepatitis C   . Hypertension   . 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 No pertinent surgical history.  There were no vitals filed for this visit.   Subjective Assessment - 12/04/20 0803    Subjective Pt reports that he did well at the beach but did have a lot of steps. He reports that he did well going up but coming down he was a little more fearful. He said last Thursday he injured right leg on steps thinking he pulled a muscle. Has been having pain in right quad since then which is why he missed appointment Monday as had taken something and slept through appointment. Reports it is gradually getting better. He stayed off leg yesterday.    Pertinent History Bells Palsy, HepC, HTN, sunlight sensitivity    Limitations Walking    How long can you walk comfortably? approx 30-45 mins    Patient Stated Goals "I want to stop this swaying."    Currently in Pain? Yes    Pain Score 5     Pain Location Leg    Pain Orientation Right     Pain Descriptors / Indicators Tightness    Pain Type Acute pain    Pain Onset In the past 7 days    Pain Frequency Intermittent    Aggravating Factors  high stepping    Pain Relieving Factors rest, ice, heat, biofreeze                             [facility] Adult PT Treatment/Exercise - 12/04/20 0808      Ambulation/Gait   Ambulation/Gait Yes    Ambulation/Gait Assistance 5: Supervision    Ambulation/Gait Assistance Details Pt able to demonstrate more narrow BOS with gait today with cues to try to relax arms for more arm swing.    Ambulation Distance (Feet) 230 Feet    Assistive device None    Gait Pattern Step-through pattern;Ataxic    Ambulation Surface Level;Indoor      Therapeutic Activites    Therapeutic Activities Other Therapeutic Activities    Other Therapeutic Activities PT assessed right hip/thigh due to reports of new onset pain since last Thursday when was going up step. Pt had no pain with passive hip flexion. No pain with overpressure added. A/P hip mob only slightly tender at  right lateral hip but not like pain he was getting. No pain with passive right hip extension sidelying just some stretching. Hip flexion strength on rigth 4+/5 on right with no pain.      Neuro Re-ed    Neuro Re-ed Details  At counter on airex: eyes open and eyes closed x 30 sec x 2 each, head turns left/right x 10, alternating shoulder flexion x 10, D1 diagonals with 2.2# med ball x 10 each side then overhead x 10 with 2.2# ball. CGA with activities at times. Increased sway significantly with eyes closed but no more than CGA needed to maintain. Verbal cues to engage core to help with stability throughout. Step-up on airex then march with opposite leg x 10 each side with fingertip support CGA. Pt cued to just raise right hip to what was comfortable. Then rockerboard positioned ant/post maintaining level x 30 sec, looking up/down x 10, weight shifting forward/back rocking board x 10 CGA. Pt  denied any signficant right thigh/hip pain with this.      Exercises   Exercises Other Exercises    Other Exercises  Demonstrated right hip flexor stretch in [place] test position and pt able to perform. Denies any pain just stretch. PT also demonstrated standing right hip flexor stretch. Instructed to perform both within pain free range.                  PT Education - 12/04/20 1332    Education Details Added hip flexor stretches to HEP. Advised should not be painful. Pt to continue with ice/heat to right hip flexor/quad as needed and avoid quick movements.    Person(s) Educated Patient    Methods Explanation    Comprehension Verbalized understanding            PT Short Term Goals - 12/04/20 1338      PT SHORT TERM GOAL #1   Title Pt will be able to maintain condition 4 of MCSIB with decreased sway and improved stability not needing close supervision for improved stability.    Baseline 12/04/20 Pt has only been seen 1 visit since recert as was out of town. Still needs close supervision with increased sway with eyes closed.    Time 3    Period Weeks    Status Not Met    Target Date 12/05/20             PT Long Term Goals - 11/14/20 1601      PT LONG TERM GOAL #1   Title Pt will be IND with final HEP in order to indicate dec fall risk and improved functional mobility. (Target Date: 11/17/20)    Time 6    Period Weeks    Status On-going    Target Date 12/26/20      PT LONG TERM GOAL #2   Title Pt will improve FGA to >/=26/30 in order to indicate dec fall risk.    Baseline 11/11/20: 24/30 scored    Time 6    Period Weeks    Status On-going    Target Date 12/26/20      PT LONG TERM GOAL #3   Title Pt will ambulate >1000' over varying surfaces at mod I level without overt LOB/gait compensations in order to indicate safety with leisure activities.    Time 6    Period Weeks    Status On-going    Target Date 12/26/20  Plan - 12/04/20 1340     Clinical Impression Statement Pt reporting increased right quad/hip flexor pain. After testing appears he may have strained muscle end of last week. Has been improving since then. PT focused on more static balance activities to decrease strain on hip flexor today. Pt still needs close supervision on condition 4 of MCTSIB. He continues to do better when he focuses on engaging core. BOS was not as wide with gait today.    Personal Factors and Comorbidities Comorbidity 3+;Time since onset of injury/illness/exacerbation    Comorbidities see above    Examination-Activity Limitations Lift;Squat;Locomotion Level;Stairs;Stand    Examination-Participation Restrictions Community Activity;Yard Work;Shop    Stability/Clinical Decision Making Evolving/Moderate complexity    Rehab Potential Good    PT Frequency 2x / week   followed by 1x/week for 4 weeks.   PT Duration 2 weeks    PT Treatment/Interventions ADLs/Self Care Home Management;Aquatic Therapy;DME Instruction;Gait training;Stair training;Functional mobility training;Therapeutic activities;Therapeutic exercise;Balance training;Neuromuscular re-education;Patient/family education;Visual/perceptual remediation/compensation;Vestibular    PT Next Visit Plan How is right quad/hip flexor doing? Continues to work on balance more on compliant surfaces with focus on engaging core and slowing down movements with dynamic tasks, gait over and around obstacles.    PT Home Exercise Plan Access Code: [email or web address]- consolidated/advanced on 11/06/20- pt was reporting having too many ex's to do at home and wanted to have just a few to focus on. Keep this in mind if adding anything.    Consulted and Agree with Plan of Care Patient           Patient will benefit from skilled therapeutic intervention in order to improve the following deficits and impairments:  Abnormal gait,Decreased activity tolerance,Decreased balance,Decreased coordination,Decreased endurance,Decreased  mobility,Impaired UE functional use,Postural dysfunction  Visit Diagnosis: Other abnormalities of gait and mobility  Unsteadiness on feet     Problem List Patient Active Problem List   Diagnosis Date Noted  . Benign prostatic hyperplasia with urinary obstruction 07/30/2020  . Incomplete emptying of bladder 07/30/2020  . Essential hypertension 11/08/2014  . Hepatitis C 11/08/2014    Electa Sniff, PT, DPT, NCS 12/04/2020, 1:43 PM  [facility] [address] [place], [place], [address] Phone: [phone]   Fax:  [phone]  Name: Joseph Duffy MRN: [id number] Date of Birth: 1956/01/11

## 2020-12-09 ENCOUNTER — Ambulatory Visit: Payer: Medicare Other

## 2020-12-10 ENCOUNTER — Ambulatory Visit: Payer: Medicare Other | Admitting: Urology

## 2020-12-10 ENCOUNTER — Encounter: Payer: Self-pay | Admitting: Urology

## 2020-12-10 ENCOUNTER — Other Ambulatory Visit: Payer: Self-pay

## 2020-12-10 VITALS — BP 147/67 | HR 74

## 2020-12-10 DIAGNOSIS — N3281 Overactive bladder: Secondary | ICD-10-CM | POA: Diagnosis not present

## 2020-12-10 LAB — URINALYSIS, ROUTINE W REFLEX MICROSCOPIC
Bilirubin, UA: NEGATIVE
Glucose, UA: NEGATIVE
Ketones, UA: NEGATIVE
Leukocytes,UA: NEGATIVE
Nitrite, UA: NEGATIVE
Protein,UA: NEGATIVE
RBC, UA: NEGATIVE
Specific Gravity, UA: 1.02 (ref 1.005–1.030)
Urobilinogen, Ur: 1 mg/dL (ref 0.2–1.0)
pH, UA: 6.5 (ref 5.0–7.5)

## 2020-12-10 LAB — BLADDER SCAN AMB NON-IMAGING: Scan Result: 572

## 2020-12-10 MED ORDER — CIPROFLOXACIN HCL 500 MG PO TABS
500.0000 mg | ORAL_TABLET | Freq: Once | ORAL | Status: AC
  Administered 2020-12-10: 500 mg via ORAL

## 2020-12-10 NOTE — Patient Instructions (Signed)

## 2020-12-10 NOTE — Progress Notes (Signed)
Urological Symptom Review PVR- 572  Patient is experiencing the following symptoms: Frequent urination Burning/pain with urination Get up at night to urinate Leakage of urine Stream starts and stops Weak stream Erection problems (male only)   Review of Systems  Gastrointestinal (upper)  : Negative for upper GI symptoms  Gastrointestinal (lower) : Negative for lower GI symptoms  Constitutional : Negative for symptoms  Skin: Negative for skin symptoms  Eyes: Negative for eye symptoms  Ear/Nose/Throat : Negative for Ear/Nose/Throat symptoms  Hematologic/Lymphatic: Negative for Hematologic/Lymphatic symptoms  Cardiovascular : Negative for cardiovascular symptoms  Respiratory : Negative for respiratory symptoms  Endocrine: Negative for endocrine symptoms  Musculoskeletal: Negative for musculoskeletal symptoms  Neurological: Negative for neurological symptoms  Psychologic: Negative for psychiatric symptoms

## 2020-12-10 NOTE — Progress Notes (Signed)
   12/10/20  CC: [email or web address] BPH  HPI: Joseph Duffy is a 65yo here for followup for BPH. He has had no improveemnt in his LUTS on rapaflo. PVR 500cc. IPSS 25 QOL 5. Blood pressure (!) 147/67, pulse 74. NED. A&Ox3.   No respiratory distress   Abd soft, NT, ND Normal phallus with bilateral descended testicles  Cystoscopy Procedure Note  Patient identification was confirmed, informed consent was obtained, and patient was prepped using Betadine solution.  Lidocaine jelly was administered per urethral meatus.     Pre-Procedure: - Inspection reveals a normal caliber ureteral meatus.  Procedure: The flexible cystoscope was introduced without difficulty - No urethral strictures/lesions are present. - Enlarged prostate no median lobe - Normal bladder neck - Bilateral ureteral orifices identified - Bladder mucosa  reveals no ulcers, tumors, or lesions - No bladder stones - No trabeculation  Retroflexion shows no intravesical prostatic protrusion   Post-Procedure: - Patient tolerated the procedure well  Assessment/ Plan: We discussed the management of his BPH including continued medical therapy, Rezum, Urolift, TURP and simple prostatectomy. After discussing the options the patient has elected to proceed with Urolift. Risks/benefits/alternatives discussed.   No follow-ups on file.  Nicolette Bang, MD

## 2020-12-16 ENCOUNTER — Ambulatory Visit: Payer: Medicare Other | Attending: Psychiatry

## 2020-12-16 ENCOUNTER — Other Ambulatory Visit: Payer: Self-pay

## 2020-12-16 DIAGNOSIS — R2681 Unsteadiness on feet: Secondary | ICD-10-CM | POA: Diagnosis not present

## 2020-12-16 DIAGNOSIS — R278 Other lack of coordination: Secondary | ICD-10-CM | POA: Diagnosis not present

## 2020-12-16 DIAGNOSIS — R2689 Other abnormalities of gait and mobility: Secondary | ICD-10-CM | POA: Diagnosis not present

## 2020-12-16 DIAGNOSIS — M6281 Muscle weakness (generalized): Secondary | ICD-10-CM | POA: Insufficient documentation

## 2020-12-16 NOTE — Patient Instructions (Signed)
Access Code: [email or web address] URL: [email or web address] Date: 12/16/2020 Prepared by: Cherly Anderson  Exercises Walking March - 1 x daily - 5 x weekly - 1 sets - 3 reps Tandem Walking with Counter Support - 1 x daily - 5 x weekly - 1 sets - 3 reps Wide Tandem Stance on Foam Pad with Eyes Closed - 1 x daily - 5 x weekly - 1 sets - 3 reps - 30 hold Romberg Stance Eyes Closed on Foam Pad - 1 x daily - 5 x weekly - 1 sets - 3 reps - 30 hold Wide Stance with Eyes Closed and Head Nods on Foam Pad - 1 x daily - 5 x weekly - 1 sets - 10 reps Thomas Stretch on Table - 2-3 x daily - 7 x weekly - 1 sets - 3 reps - 30 sec hold Standing Hip Flexor Stretch - 2-3 x daily - 7 x weekly - 1 sets - 3 reps - 30 sec hold Prone Press Up On Elbows - 3-4 x daily - 7 x weekly - 1 sets - 10 reps

## 2020-12-16 NOTE — Therapy (Signed)
[facility] [address] [place], [place], [address] Phone: [phone]   Fax:  [phone]  Physical Therapy Treatment  Patient Details  Name: Joseph Duffy MRN: [id number] Date of Birth: [birth year] Referring Provider (PT): William Dalton, MD   Encounter Date: 12/16/2020   PT End of Session - 12/16/20 0805    Visit Number 15    Number of Visits 21    Date for PT Re-Evaluation 01/13/21    Authorization Type [employer] (10th visit PN needed)    Progress Note Due on Visit 20    PT Start Time 0803    PT Stop Time 0845    PT Time Calculation (min) 42 min    Equipment Utilized During Treatment Gait belt    Activity Tolerance Patient tolerated treatment well;No increased pain    Behavior During Therapy WFL for tasks assessed/performed           Past Medical History:  Diagnosis Date  . Ataxia   . Bell's palsy 12/2019   right side  . Cirrhosis of liver ([place])   . Hepatitis C   . Hypertension   . 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)     History reviewed. No pertinent surgical history.  There were no vitals filed for this visit.   Subjective Assessment - 12/16/20 0805    Subjective Pt reports that he is still having a lot of trouble with right leg. He reports that he is really favoring it. Noticing some numbness in right leg as well as weakness. Still feels like top of right leg is really sensitive. Numbness refers down to lateral mid calf. He reports one time where went down on right knee due to weakness. Pt reports that he has seen chiropractor in the past which has helped but issue was always on the left. Now is on the right.    Pertinent History Bells Palsy, HepC, HTN, sunlight sensitivity    Limitations Walking    How long can you walk comfortably? approx 30-45 mins    Patient Stated Goals "I want to stop this swaying."    Currently in Pain? Yes    Pain Score 2     Pain Location Back   right hip area    Pain Orientation Right    Pain Descriptors / Indicators Aching;Sore    Pain Type Acute pain    Pain Onset 1 to 4 weeks ago    Pain Frequency Intermittent    Aggravating Factors  prolonged walking/standing    Pain Relieving Factors rest, biofreeze              [facility] PT Assessment - 12/16/20 0811      Special Tests    Special Tests Lumbar    Lumbar Tests Straight Leg Raise      Straight Leg Raise   Findings Negative    Comment bilateral                         [facility] Adult PT Treatment/Exercise - 12/16/20 2585      Ambulation/Gait   Ambulation/Gait Yes    Ambulation/Gait Assistance Details in/out of clinic. Pt with antalgic gait on right and ataxic. Did report some improvement in balance at end of session with no pain in standing.    Assistive device None      Therapeutic Activites    Therapeutic Activities Other Therapeutic Activities    Other Therapeutic Activities PT assessed low  back due to reports of new issues. Pt had good ROM with lumbar flexion/extension/side bend and rotation with no increased pain. Negative SLR bilateral but was slightly tighter on right. Pt has some hypomobility noted in lumbar spine with PA mobs both central and unilateral with some tenderness at L4/5 both central and unilateral but no peripheral symptoms. PT performed grade 3 A/P central and right unilateral mobs at L4/5 3 bouts of 20 sec.  PT had pt perform prone on elbows that felt good x 10.                  PT Education - 12/16/20 1922    Education Details PT discussed with pt the plan to see how he does with repeated extension over next week. If pt continues to have back issues may need to follow-up with MD or get new order as PT is not for back at this time. Need to get back/hip pain resolved to be able to continue to progress balance training.    Person(s) Educated Patient    Methods Explanation;Demonstration    Comprehension Verbalized understanding;Returned  demonstration            PT Short Term Goals - 12/04/20 1338      PT SHORT TERM GOAL #1   Title Pt will be able to maintain condition 4 of MCSIB with decreased sway and improved stability not needing close supervision for improved stability.    Baseline 12/04/20 Pt has only been seen 1 visit since recert as was out of town. Still needs close supervision with increased sway with eyes closed.    Time 3    Period Weeks    Status Not Met    Target Date 12/05/20             PT Long Term Goals - 11/14/20 1601      PT LONG TERM GOAL #1   Title Pt will be IND with final HEP in order to indicate dec fall risk and improved functional mobility. (Target Date: 11/17/20)    Time 6    Period Weeks    Status On-going    Target Date 12/26/20      PT LONG TERM GOAL #2   Title Pt will improve FGA to >/=26/30 in order to indicate dec fall risk.    Baseline 11/11/20: 24/30 scored    Time 6    Period Weeks    Status On-going    Target Date 12/26/20      PT LONG TERM GOAL #3   Title Pt will ambulate >1000' over varying surfaces at mod I level without overt LOB/gait compensations in order to indicate safety with leisure activities.    Time 6    Period Weeks    Status On-going    Target Date 12/26/20                 Plan - 12/16/20 1923    Clinical Impression Statement Pt is continuing to have pain in right hip radiating now to mid calf area. PT assessed back as seems may be coming more from there. Pt was found to have strength WFL bilateral except for hip flexion 4/5 on right. Negative SLR bilateral and lumbar ROM WFL. Pt was hypomobile in lumbar spine and had decreased peripheral symptoms with prone on elbows. PT spent session discussing with pt current thoughts on his pain. 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 Will follow-up next week but if continues to have  issues may need to follow-up with MD as current PT orders are not for back pain. Pt did report feeling better at  end of session after extension activities.    Personal Factors and Comorbidities Comorbidity 3+;Time since onset of injury/illness/exacerbation    Comorbidities see above    Examination-Activity Limitations Lift;Squat;Locomotion Level;Stairs;Stand    Examination-Participation Restrictions Community Activity;Yard Work;Shop    Stability/Clinical Decision Making Evolving/Moderate complexity    Rehab Potential Good    PT Frequency 2x / week   followed by 1x/week for 4 weeks.   PT Duration 2 weeks    PT Treatment/Interventions ADLs/Self Care Home Management;Aquatic Therapy;DME Instruction;Gait training;Stair training;Functional mobility training;Therapeutic activities;Therapeutic exercise;Balance training;Neuromuscular re-education;Patient/family education;Visual/perceptual remediation/compensation;Vestibular    PT Next Visit Plan How is back/right hip doing? If continuing to have issues may need to refer back to MD with possible need for new order for ortho clinic to address back further. Continues to work on balance more on compliant surfaces with focus on engaging core and slowing down movements with dynamic tasks, gait over and around obstacles.    PT Home Exercise Plan Access Code: [email or web address] consolidated/advanced on 11/06/20- pt was reporting having too many ex's to do at home and wanted to have just a few to focus on. Keep this in mind if adding anything.    Consulted and Agree with Plan of Care Patient           Patient will benefit from skilled therapeutic intervention in order to improve the following deficits and impairments:  Abnormal gait,Decreased activity tolerance,Decreased balance,Decreased coordination,Decreased endurance,Decreased mobility,Impaired UE functional use,Postural dysfunction  Visit Diagnosis: Other abnormalities of gait and mobility  Unsteadiness on feet     Problem List Patient Active Problem List   Diagnosis Date Noted  . Benign prostatic hyperplasia with  urinary obstruction 07/30/2020  . Incomplete emptying of bladder 07/30/2020  . Essential hypertension 11/08/2014  . Hepatitis C 11/08/2014    Electa Sniff, PT, DPT, NCS 12/16/2020, 7:29 PM  [facility] [address] [place], [place], [address] Phone: [phone]   Fax:  [phone]  Name: Joseph Duffy MRN: [id number] Date of Birth: Feb 04, 1956

## 2020-12-23 ENCOUNTER — Ambulatory Visit: Payer: Medicare Other | Admitting: Physical Therapy

## 2020-12-23 ENCOUNTER — Encounter: Payer: Self-pay | Admitting: Physical Therapy

## 2020-12-23 ENCOUNTER — Other Ambulatory Visit: Payer: Self-pay

## 2020-12-23 DIAGNOSIS — R278 Other lack of coordination: Secondary | ICD-10-CM

## 2020-12-23 DIAGNOSIS — R2681 Unsteadiness on feet: Secondary | ICD-10-CM

## 2020-12-23 DIAGNOSIS — R2689 Other abnormalities of gait and mobility: Secondary | ICD-10-CM | POA: Diagnosis not present

## 2020-12-23 DIAGNOSIS — M6281 Muscle weakness (generalized): Secondary | ICD-10-CM | POA: Diagnosis not present

## 2020-12-23 NOTE — Patient Instructions (Signed)
Joseph Duffy  12/23/2020     @PREFPERIOPPHARMACY @   Your procedure is scheduled on  12/29/2020.   Report to [facility] at    1245  P.M.  Call this number if you have problems the morning of surgery:  [phone]   Remember:  Do not eat or drink after midnight.      Take these medicines the morning of surgery with A SIP OF WATER    NONE     Please brush your teeth.  Do not wear jewelry, make-up or nail polish.  Do not wear lotions, powders, or perfumes, or deodorant.  Do not shave 48 hours prior to surgery.  Men may shave face and neck.  Do not bring valuables to the hospital.  [facility] is not responsible for any belongings or valuables.  Contacts, dentures or bridgework may not be worn into surgery.  Leave your suitcase in the car.  After surgery it may be brought to your room.  For patients admitted to the hospital, discharge time will be determined by your treatment team.  Patients discharged the day of surgery will not be allowed to drive home and must have someone with them for 24 hours.    Special instructions:    DO NOT smoke tobacco or vape for 24 hours before your procedure.  Please read over the following fact sheets that you were given. Coughing and Deep Breathing, Surgical Site Infection Prevention, Anesthesia Post-op Instructions, and Care and Recovery After Surgery      Cystoscopy Cystoscopy is a procedure that is used to help diagnose and sometimes treat conditions that affect the lower urinary tract. The lower urinary tract includes the bladder and the urethra. The urethra is the tube that drains urine from the bladder. Cystoscopy is done using a thin, tube-shaped instrument with a light and camera at the end (cystoscope). The cystoscope may be hard or flexible, depending on the goal of theprocedure. The cystoscope is inserted through the urethra, into the bladder. Cystoscopy may be recommended if you have: Urinary tract infections that  keep coming back. Blood in the urine (hematuria). An inability to control when you urinate (urinary incontinence) or an overactive bladder. Unusual cells found in a urine sample. A blockage in the urethra, such as a urinary stone. Painful urination. An abnormality in the bladder found during an intravenous pyelogram (IVP) or CT scan. Cystoscopy may also be done to remove a sample of tissue to be examined under a microscope (biopsy). Tell a health care provider about: Any allergies you have. All medicines you are taking, including vitamins, herbs, eye drops, creams, and over-the-counter medicines. Any problems you or family members have had with anesthetic medicines. Any blood disorders you have. Any surgeries you have had. Any medical conditions you have. Whether you are pregnant or may be pregnant. What are the risks? Generally, this is a safe procedure. However, problems may occur, including: Infection. Bleeding. Allergic reactions to medicines. Damage to other structures or organs. What happens before the procedure? Medicines Ask your health care provider about: Changing or stopping your regular medicines. This is especially important if you are taking diabetes medicines or blood thinners. Taking medicines such as aspirin and ibuprofen. These medicines can thin your blood. Do not take these medicines unless your health care provider tells you to take them. Taking over-the-counter medicines, vitamins, herbs, and supplements. Tests You may have an exam or testing, such as: X-rays of the bladder,  urethra, or kidneys. CT scan of the abdomen or pelvis. Urine tests to check for signs of infection. General instructions Follow instructions from your health care provider about eating or drinking restrictions. Ask your health care provider what steps will be taken to help prevent infection. These steps may include: Washing skin with a germ-killing soap. Taking antibiotic medicine. Plan  to have a responsible adult take you home from the hospital or clinic. What happens during the procedure?  You will be given one or more of the following: A medicine to help you relax (sedative). A medicine to numb the area (local anesthetic). The area around the opening of your urethra will be cleaned. The cystoscope will be passed through your urethra into your bladder. Germ-free (sterile) fluid will flow through the cystoscope to fill your bladder. The fluid will stretch your bladder so that your health care provider can clearly examine your bladder walls. Your doctor will look at the urethra and bladder. Your doctor may take a biopsy or remove stones. The cystoscope will be removed, and your bladder will be emptied. The procedure may vary among health care providers and hospitals. What can I expect after the procedure? After the procedure, it is common to have: Some soreness or pain in your abdomen and urethra. Urinary symptoms. These include: Mild pain or burning when you urinate. Pain should stop within a few minutes after you urinate. This may last for up to 1 week. A small amount of blood in your urine for several days. Feeling like you need to urinate but producing only a small amount of urine. Follow these instructions at home: Medicines Take over-the-counter and prescription medicines only as told by your health care provider. If you were prescribed an antibiotic medicine, take it as told by your health care provider. Do not stop taking the antibiotic even if you start to feel better. General instructions Return to your normal activities as told by your health care provider. Ask your health care provider what activities are safe for you. If you were given a sedative during the procedure, it can affect you for several hours. Do not drive or operate machinery until your health care provider says that it is safe. Watch for any blood in your urine. If the amount of blood in your  urine increases, call your health care provider. Follow instructions from your health care provider about eating or drinking restrictions. If a tissue sample was removed for testing (biopsy) during your procedure, it is up to you to get your test results. Ask your health care provider, or the department that is doing the test, when your results will be ready. Drink enough fluid to keep your urine pale yellow. Keep all follow-up visits. This is important. Contact a health care provider if: You have pain that gets worse or does not get better with medicine, especially pain when you urinate. You have trouble urinating. You have more blood in your urine. Get help right away if: You have blood clots in your urine. You have abdominal pain. You have a fever or chills. You are unable to urinate. Summary Cystoscopy is a procedure that is used to help diagnose and sometimes treat conditions that affect the lower urinary tract. Cystoscopy is done using a thin, tube-shaped instrument with a light and camera at the end. After the procedure, it is common to have some soreness or pain in your abdomen and urethra. Watch for any blood in your urine. If the amount of blood in your  urine increases, call your health care provider. If you were prescribed an antibiotic medicine, take it as told by your health care provider. Do not stop taking the antibiotic even if you start to feel better. This information is not intended to replace advice given to you by your health care provider. Make sure you discuss any questions you have with your healthcare provider. Document Revised: 02/08/2020 Document Reviewed: 02/08/2020 Elsevier Patient Education  [place] Anesthesia, Adult, Care After This sheet gives you information about how to care for yourself after your procedure. Your health care provider may also give you more specific instructions. If you have problems or questions, contact your health  careprovider. What can I expect after the procedure? After the procedure, the following side effects are common: Pain or discomfort at the IV site. Nausea. Vomiting. Sore throat. Trouble concentrating. Feeling cold or chills. Feeling weak or tired. Sleepiness and fatigue. Soreness and body aches. These side effects can affect parts of the body that were not involved in surgery. Follow these instructions at home: For the time period you were told by your health care provider:  Rest. Do not participate in activities where you could fall or become injured. Do not drive or use machinery. Do not drink alcohol. Do not take sleeping pills or medicines that cause drowsiness. Do not make important decisions or sign legal documents. Do not take care of children on your own.  Eating and drinking Follow any instructions from your health care provider about eating or drinking restrictions. When you feel hungry, start by eating small amounts of foods that are soft and easy to digest (bland), such as toast. Gradually return to your regular diet. Drink enough fluid to keep your urine pale yellow. If you vomit, rehydrate by drinking water, juice, or clear broth. General instructions If you have sleep apnea, surgery and certain medicines can increase your risk for breathing problems. Follow instructions from your health care provider about wearing your sleep device: Anytime you are sleeping, including during daytime naps. While taking prescription pain medicines, sleeping medicines, or medicines that make you drowsy. Have a responsible adult stay with you for the time you are told. It is important to have someone help care for you until you are awake and alert. Return to your normal activities as told by your health care provider. Ask your health care provider what activities are safe for you. Take over-the-counter and prescription medicines only as told by your health care provider. If you smoke,  do not smoke without supervision. Keep all follow-up visits as told by your health care provider. This is important. Contact a health care provider if: You have nausea or vomiting that does not get better with medicine. You cannot eat or drink without vomiting. You have pain that does not get better with medicine. You are unable to pass urine. You develop a skin rash. You have a fever. You have redness around your IV site that gets worse. Get help right away if: You have difficulty breathing. You have chest pain. You have blood in your urine or stool, or you vomit blood. Summary After the procedure, it is common to have a sore throat or nausea. It is also common to feel tired. Have a responsible adult stay with you for the time you are told. It is important to have someone help care for you until you are awake and alert. When you feel hungry, start by eating small amounts of foods that are soft and  easy to digest (bland), such as toast. Gradually return to your regular diet. Drink enough fluid to keep your urine pale yellow. Return to your normal activities as told by your health care provider. Ask your health care provider what activities are safe for you. This information is not intended to replace advice given to you by your health care provider. Make sure you discuss any questions you have with your healthcare provider. Document Revised: 03/13/2020 Document Reviewed: 10/11/2019 Elsevier Patient Education  2022 [place]. How to Use Chlorhexidine for Bathing Chlorhexidine gluconate (CHG) is a germ-killing (antiseptic) solution that is used to clean the skin. It can get rid of the bacteria that normally live on the skin and can keep them away for about 24 hours. To clean your skin with CHG, you may be given: A CHG solution to use in the shower or as part of a sponge bath. A prepackaged cloth that contains CHG. Cleaning your skin with CHG may help lower the risk for infection: While  you are staying in the intensive care unit of the hospital. If you have a vascular access, such as a central line, to provide short-term or long-term access to your veins. If you have a catheter to drain urine from your bladder. If you are on a ventilator. A ventilator is a machine that helps you breathe by moving air in and out of your lungs. After surgery. What are the risks? Risks of using CHG include: A skin reaction. Hearing loss, if CHG gets in your ears. Eye injury, if CHG gets in your eyes and is not rinsed out. The CHG product catching fire. Make sure that you avoid smoking and flames after applying CHG to your skin. Do not use CHG: If you have a chlorhexidine allergy or have previously reacted to chlorhexidine. On babies younger than 6 months of age. How to use CHG solution Use CHG only as told by your health care provider, and follow the instructions on the label. Use the full amount of CHG as directed. Usually, this is one bottle. During a shower Follow these steps when using CHG solution during a shower (unless your health care provider gives you different instructions): Start the shower. Use your normal soap and shampoo to wash your face and hair. Turn off the shower or move out of the shower stream. Pour the CHG onto a clean washcloth. Do not use any type of brush or rough-edged sponge. Starting at your neck, lather your body down to your toes. Make sure you follow these instructions: If you will be having surgery, pay special attention to the part of your body where you will be having surgery. Scrub this area for at least 1 minute. Do not use CHG on your head or face. If the solution gets into your ears or eyes, rinse them well with water. Avoid your genital area. Avoid any areas of skin that have broken skin, cuts, or scrapes. Scrub your back and under your arms. Make sure to wash skin folds. Let the lather sit on your skin for 1-2 minutes or as long as told by your  health care provider. Thoroughly rinse your entire body in the shower. Make sure that all body creases and crevices are rinsed well. Dry off with a clean towel. Do not put any substances on your body afterward--such as powder, lotion, or perfume--unless you are told to do so by your health care provider. Only use lotions that are recommended by the manufacturer. Put on clean clothes  or pajamas. If it is the night before your surgery, sleep in clean sheets.  During a sponge bath Follow these steps when using CHG solution during a sponge bath (unless your health care provider gives you different instructions): Use your normal soap and shampoo to wash your face and hair. Pour the CHG onto a clean washcloth. Starting at your neck, lather your body down to your toes. Make sure you follow these instructions: If you will be having surgery, pay special attention to the part of your body where you will be having surgery. Scrub this area for at least 1 minute. Do not use CHG on your head or face. If the solution gets into your ears or eyes, rinse them well with water. Avoid your genital area. Avoid any areas of skin that have broken skin, cuts, or scrapes. Scrub your back and under your arms. Make sure to wash skin folds. Let the lather sit on your skin for 1-2 minutes or as long as told by your health care provider. Using a different clean, wet washcloth, thoroughly rinse your entire body. Make sure that all body creases and crevices are rinsed well. Dry off with a clean towel. Do not put any substances on your body afterward--such as powder, lotion, or perfume--unless you are told to do so by your health care provider. Only use lotions that are recommended by the manufacturer. Put on clean clothes or pajamas. If it is the night before your surgery, sleep in clean sheets. How to use CHG prepackaged cloths Only use CHG cloths as told by your health care provider, and follow the instructions on the  label. Use the CHG cloth on clean, dry skin. Do not use the CHG cloth on your head or face unless your health care provider tells you to. When washing with the CHG cloth: Avoid your genital area. Avoid any areas of skin that have broken skin, cuts, or scrapes. Before surgery Follow these steps when using a CHG cloth to clean before surgery (unless your health care provider gives you different instructions): Using the CHG cloth, vigorously scrub the part of your body where you will be having surgery. Scrub using a back-and-forth motion for 3 minutes. The area on your body should be completely wet with CHG when you are done scrubbing. Do not rinse. Discard the cloth and let the area air-dry. Do not put any substances on the area afterward, such as powder, lotion, or perfume. Put on clean clothes or pajamas. If it is the night before your surgery, sleep in clean sheets.  For general bathing Follow these steps when using CHG cloths for general bathing (unless your health care provider gives you different instructions). Use a separate CHG cloth for each area of your body. Make sure you wash between any folds of skin and between your fingers and toes. Wash your body in the following order, switching to a new cloth after each step: The front of your neck, shoulders, and chest. Both of your arms, under your arms, and your hands. Your stomach and groin area, avoiding the genitals. Your right leg and foot. Your left leg and foot. The back of your neck, your back, and your buttocks. Do not rinse. Discard the cloth and let the area air-dry. Do not put any substances on your body afterward--such as powder, lotion, or perfume--unless you are told to do so by your health care provider. Only use lotions that are recommended by the manufacturer. Put on clean clothes or pajamas. Contact  a health care provider if: Your skin gets irritated after scrubbing. You have questions about using your solution or  cloth. Get help right away if: Your eyes become very red or swollen. Your eyes itch badly. Your skin itches badly and is red or swollen. Your hearing changes. You have trouble seeing. You have swelling or tingling in your mouth or throat. You have trouble breathing. You swallow any chlorhexidine. Summary Chlorhexidine gluconate (CHG) is a germ-killing (antiseptic) solution that is used to clean the skin. Cleaning your skin with CHG may help to lower your risk for infection. You may be given CHG to use for bathing. It may be in a bottle or in a prepackaged cloth to use on your skin. Carefully follow your health care provider's instructions and the instructions on the product label. Do not use CHG if you have a chlorhexidine allergy. Contact your health care provider if your skin gets irritated after scrubbing. This information is not intended to replace advice given to you by your health care provider. Make sure you discuss any questions you have with your healthcare provider. Document Revised: 11/09/2019 Document Reviewed: 12/14/2019 Elsevier Patient Education  [place].

## 2020-12-23 NOTE — Therapy (Signed)
[facility] [address] [place], [place], [address] Phone: [phone]   Fax:  [phone]  Physical Therapy Treatment  Patient Details  Name: Joseph Duffy MRN: [id number] Date of Birth: 04/28/1956 Referring Provider (PT): William Dalton, MD   Encounter Date: 12/23/2020   PT End of Session - 12/23/20 0809     Visit Number 16    Number of Visits 21    Date for PT Re-Evaluation 01/13/21    Authorization Type [employer] Medicare (10th visit PN needed)    Progress Note Due on Visit 20    PT Start Time 0805    PT Stop Time 0845    PT Time Calculation (min) 40 min    Equipment Utilized During Treatment Gait belt    Activity Tolerance Patient tolerated treatment well;No increased pain    Behavior During Therapy [facility] for tasks assessed/performed             Past Medical History:  Diagnosis Date   Ataxia    Bell's palsy 12/2019   right side   Cirrhosis of liver ([place])    Hepatitis C    Hypertension    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 No pertinent surgical history.  There were no vitals filed for this visit.   Subjective Assessment - 12/23/20 0806     Subjective Reports the back pain is getting better with the extenion ex's. Has been able to walk around and move better this week. No falls, "a couple of bumps, stumbles".    Pertinent History Bells Palsy, HepC, HTN, sunlight sensitivity    Limitations Walking    How long can you walk comfortably? approx 30-45 mins    Patient Stated Goals "I want to stop this swaying."    Currently in Pain? Yes    Pain Score 4     Pain Location Back    Pain Orientation Right    Pain Descriptors / Indicators Aching;Sore    Pain Type Acute pain    Pain Radiating Towards into left buttocks and left leg    Pain Onset 1 to 4 weeks ago    Pain Frequency Intermittent    Aggravating Factors  increased walking/standing    Pain Relieving Factors rest,  Biofreeze, extension ex's                     [facility] Adult PT Treatment/Exercise - 12/23/20 0810       Transfers   Transfers Sit to Stand;Stand to Sit    Sit to Stand 6: Modified independent (Device/Increase time)    Stand to Sit 6: Modified independent (Device/Increase time)      Ambulation/Gait   Ambulation/Gait Yes    Ambulation/Gait Assistance 5: Supervision    Ambulation/Gait Assistance Details around clinic with session    Assistive device None    Gait Pattern Step-through pattern;Ataxic    Ambulation Surface Level;Indoor      High Level Balance   High Level Balance Activities Side stepping;Marching forwards;Marching backwards;Tandem walking   tandem/toe walking forward/backward   High Level Balance Comments on red/blue mats next to counter top- 3 laps each/each way with occasional touch to counter for balance assistance. min guard to min assist with cues on posture and ex form/technique. pt with several episodes of anterior balance loss with toe walking backward needed mod assist x2 occasions.  Balance Exercises - 12/23/20 0821       Balance Exercises: Standing   SLS with Vectors Foam/compliant surface    SLS with Vectors Limitations on balance board in ant/post direction with 2 tall cones in front- alternating forward foot taps, then cross foot taps with touch to bars as needed for balance assistance. cues on posture/abd bracing and weight shifting to assist with balance.    Rockerboard Anterior/posterior;Lateral;Head turns;EO;EC;30 seconds;Other reps (comment);Intermittent UE support;Limitations    Rockerboard Limitations performed both ways on the balance board: rocking the board with emphasis on tall posutre with EO, progressing to EC with min guard to min assist, cues on core activation and weight shifting to assist with balance; then had pt hold the board steady with EC, min to mod assist wiht several posterior balance losses,cues for core  activation and anterior weight shifting. Progressed to holding the board steady for head movements left<>right, up<>down for ~10 reps each with EO, min assist for balance with continued cues as stated.                 PT Short Term Goals - 12/04/20 1338       PT SHORT TERM GOAL #1   Title Pt will be able to maintain condition 4 of MCSIB with decreased sway and improved stability not needing close supervision for improved stability.    Baseline 12/04/20 Pt has only been seen 1 visit since recert as was out of town. Still needs close supervision with increased sway with eyes closed.    Time 3    Period Weeks    Status Not Met    Target Date 12/05/20               PT Long Term Goals - 11/14/20 1601       PT LONG TERM GOAL #1   Title Pt will be IND with final HEP in order to indicate dec fall risk and improved functional mobility. (Target Date: 11/17/20)    Time 6    Period Weeks    Status On-going    Target Date 12/26/20      PT LONG TERM GOAL #2   Title Pt will improve FGA to >/=26/30 in order to indicate dec fall risk.    Baseline 11/11/20: 24/30 scored    Time 6    Period Weeks    Status On-going    Target Date 12/26/20      PT LONG TERM GOAL #3   Title Pt will ambulate >1000' over varying surfaces at mod I level without overt LOB/gait compensations in order to indicate safety with leisure activities.    Time 6    Period Weeks    Status On-going    Target Date 12/26/20                   Plan - 12/23/20 0923     Clinical Impression Statement Today's skilled session continued to focus on balance training with varied assistance needed. No increase in back/leg pain reported with activities performed. Pt continues to be challenged with vision removed and single leg stance activities. The pt is progressing and should benefit from continued PT to progress toward unmet goals.    Personal Factors and Comorbidities Comorbidity 3+;Time since onset of  injury/illness/exacerbation    Comorbidities see above    Examination-Activity Limitations Lift;Squat;Locomotion Level;Stairs;Stand    Examination-Participation Restrictions Community Activity;Yard Work;Shop    Stability/Clinical Decision Making Evolving/Moderate complexity    Rehab Potential Good      PT Frequency 2x / week    PT Duration 2 weeks    PT Treatment/Interventions ADLs/Self Care Home Management;Aquatic Therapy;DME Instruction;Gait training;Stair training;Functional mobility training;Therapeutic activities;Therapeutic exercise;Balance training;Neuromuscular re-education;Patient/family education;Visual/perceptual remediation/compensation;Vestibular    PT Next Visit Plan check LTGs for ? recert vs discharge    PT Home Exercise Plan Access Code: [email or web address] consolidated/advanced on 11/06/20- pt was reporting having too many ex's to do at home and wanted to have just a few to focus on. Keep this in mind if adding anything.    Consulted and Agree with Plan of Care Patient             Patient will benefit from skilled therapeutic intervention in order to improve the following deficits and impairments:  Abnormal gait, Decreased activity tolerance, Decreased balance, Decreased coordination, Decreased endurance, Decreased mobility, Impaired UE functional use, Postural dysfunction  Visit Diagnosis: Other abnormalities of gait and mobility  Unsteadiness on feet  Muscle weakness (generalized)  Other lack of coordination     Problem List Patient Active Problem List   Diagnosis Date Noted   Benign prostatic hyperplasia with urinary obstruction 07/30/2020   Incomplete emptying of bladder 07/30/2020   Essential hypertension 11/08/2014   Hepatitis C 11/08/2014    Willow Ora, PTA, [facility] [address], [place], [place] [address] [phone] 12/23/20, 9:27 AM   Name: Joseph Duffy MRN: [id number] Date of Birth: April 15, 1956

## 2020-12-25 ENCOUNTER — Other Ambulatory Visit: Payer: Self-pay

## 2020-12-25 ENCOUNTER — Encounter (HOSPITAL_COMMUNITY): Payer: Self-pay

## 2020-12-25 ENCOUNTER — Encounter (HOSPITAL_COMMUNITY)
Admission: RE | Admit: 2020-12-25 | Discharge: 2020-12-25 | Disposition: A | Discharge status: Home / Self Care | Payer: Medicare Other | Source: Ambulatory Visit | Attending: Urology | Admitting: Urology

## 2020-12-25 DIAGNOSIS — Z01818 Encounter for other preprocedural examination: Secondary | ICD-10-CM | POA: Diagnosis not present

## 2020-12-25 LAB — COMPREHENSIVE METABOLIC PANEL
ALT: 23 U/L (ref 0–44)
AST: 22 U/L (ref 15–41)
Albumin: 4.3 g/dL (ref 3.5–5.0)
Alkaline Phosphatase: 53 U/L (ref 38–126)
Anion gap: 5 (ref 5–15)
BUN: 21 mg/dL (ref 8–23)
CO2: 28 mmol/L (ref 22–32)
Calcium: 9.2 mg/dL (ref 8.9–10.3)
Chloride: 104 mmol/L (ref 98–111)
Creatinine, Ser: 0.83 mg/dL (ref 0.61–1.24)
GFR, Estimated: >60 mL/min (ref >60)
Glucose, Bld: 101 mg/dL — ABNORMAL HIGH (ref 70–99)
Potassium: 3.6 mmol/L (ref 3.5–5.1)
Sodium: 137 mmol/L (ref 135–145)
Total Bilirubin: 0.4 mg/dL (ref 0.3–1.2)
Total Protein: 7.5 g/dL (ref 6.5–8.1)

## 2020-12-25 LAB — CBC WITH DIFFERENTIAL/PLATELET
Abs Immature Granulocytes: 0.02 10*3/uL (ref 0.00–0.07)
Basophils Absolute: 0.1 10*3/uL (ref 0.0–0.1)
Basophils Relative: 1 %
Eosinophils Absolute: 0.1 10*3/uL (ref 0.0–0.5)
Eosinophils Relative: 1 %
HCT: 44.5 % (ref 39.0–52.0)
Hemoglobin: 15.1 g/dL (ref 13.0–17.0)
Immature Granulocytes: 0 %
Lymphocytes Relative: 29 %
Lymphs Abs: 2 10*3/uL (ref 0.7–4.0)
MCH: 32.6 pg (ref 26.0–34.0)
MCHC: 33.9 g/dL (ref 30.0–36.0)
MCV: 96.1 fL (ref 80.0–100.0)
Monocytes Absolute: 0.9 10*3/uL (ref 0.1–1.0)
Monocytes Relative: 12 %
Neutro Abs: 4 10*3/uL (ref 1.7–7.7)
Neutrophils Relative %: 57 %
Platelets: 183 10*3/uL (ref 150–400)
RBC: 4.63 MIL/uL (ref 4.22–5.81)
RDW: 13.2 % (ref 11.5–15.5)
WBC: 7.1 10*3/uL (ref 4.0–10.5)
nRBC: 0 % (ref 0.0–0.2)

## 2020-12-25 LAB — PROTIME-INR
INR: 1 (ref 0.8–1.2)
Prothrombin Time: 13 seconds (ref 11.4–15.2)

## 2020-12-29 ENCOUNTER — Ambulatory Visit (HOSPITAL_COMMUNITY)
Admission: RE | Admit: 2020-12-29 | Discharge: 2020-12-29 | Disposition: A | Discharge status: Home / Self Care | Payer: Medicare Other | Attending: Urology | Admitting: Urology

## 2020-12-29 ENCOUNTER — Encounter (HOSPITAL_COMMUNITY): Payer: Self-pay | Admitting: Urology

## 2020-12-29 ENCOUNTER — Ambulatory Visit (HOSPITAL_COMMUNITY): Payer: Medicare Other | Admitting: Anesthesiology

## 2020-12-29 ENCOUNTER — Encounter (HOSPITAL_COMMUNITY)
Admission: RE | Disposition: A | Discharge status: Home / Self Care | Payer: Self-pay | Source: Home / Self Care | Attending: Urology

## 2020-12-29 ENCOUNTER — Other Ambulatory Visit: Payer: Self-pay

## 2020-12-29 DIAGNOSIS — Z79899 Other long term (current) drug therapy: Secondary | ICD-10-CM | POA: Diagnosis not present

## 2020-12-29 DIAGNOSIS — N138 Other obstructive and reflux uropathy: Secondary | ICD-10-CM | POA: Insufficient documentation

## 2020-12-29 DIAGNOSIS — R338 Other retention of urine: Secondary | ICD-10-CM | POA: Diagnosis not present

## 2020-12-29 DIAGNOSIS — Z825 Family history of asthma and other chronic lower respiratory diseases: Secondary | ICD-10-CM | POA: Diagnosis not present

## 2020-12-29 DIAGNOSIS — N401 Enlarged prostate with lower urinary tract symptoms: Secondary | ICD-10-CM | POA: Insufficient documentation

## 2020-12-29 DIAGNOSIS — F17210 Nicotine dependence, cigarettes, uncomplicated: Secondary | ICD-10-CM | POA: Insufficient documentation

## 2020-12-29 DIAGNOSIS — R3915 Urgency of urination: Secondary | ICD-10-CM | POA: Insufficient documentation

## 2020-12-29 HISTORY — PX: CYSTOSCOPY WITH INSERTION OF UROLIFT: SHX6678

## 2020-12-29 SURGERY — CYSTOSCOPY WITH INSERTION OF UROLIFT
Anesthesia: General

## 2020-12-29 MED ORDER — MIDAZOLAM HCL 5 MG/5ML IJ SOLN
INTRAMUSCULAR | Status: DC | PRN
  Administered 2020-12-29: 2 mg via INTRAVENOUS

## 2020-12-29 MED ORDER — CEFAZOLIN SODIUM-DEXTROSE 2-4 GM/100ML-% IV SOLN
2.0000 g | INTRAVENOUS | Status: AC
  Administered 2020-12-29: 2 g via INTRAVENOUS

## 2020-12-29 MED ORDER — EPHEDRINE SULFATE 50 MG/ML IJ SOLN
INTRAMUSCULAR | Status: DC | PRN
  Administered 2020-12-29: 5 mg via INTRAVENOUS

## 2020-12-29 MED ORDER — WATER FOR IRRIGATION, STERILE IR SOLN
Status: DC | PRN
  Administered 2020-12-29: 500 mL

## 2020-12-29 MED ORDER — FENTANYL CITRATE (PF) 100 MCG/2ML IJ SOLN
INTRAMUSCULAR | Status: AC
  Filled 2020-12-29: qty 2

## 2020-12-29 MED ORDER — LACTATED RINGERS IV SOLN: INTRAVENOUS | Status: DC

## 2020-12-29 MED ORDER — TRAMADOL HCL 50 MG PO TABS: 50.0000 mg | ORAL_TABLET | Freq: Four times a day (QID) | ORAL | 0 refills | Status: AC | PRN

## 2020-12-29 MED ORDER — DEXAMETHASONE SODIUM PHOSPHATE 10 MG/ML IJ SOLN
INTRAMUSCULAR | Status: DC | PRN
  Administered 2020-12-29: 5 mg via INTRAVENOUS

## 2020-12-29 MED ORDER — CHLORHEXIDINE GLUCONATE 0.12 % MT SOLN
15.0000 mL | Freq: Once | OROMUCOSAL | Status: AC
  Administered 2020-12-29: 15 mL via OROMUCOSAL

## 2020-12-29 MED ORDER — MIDAZOLAM HCL 2 MG/2ML IJ SOLN
INTRAMUSCULAR | Status: AC
  Filled 2020-12-29: qty 2

## 2020-12-29 MED ORDER — SODIUM CHLORIDE 0.9 % IR SOLN
Status: DC | PRN
  Administered 2020-12-29 (×2): 3000 mL

## 2020-12-29 MED ORDER — FENTANYL CITRATE (PF) 100 MCG/2ML IJ SOLN
INTRAMUSCULAR | Status: DC | PRN
  Administered 2020-12-29 (×2): 50 ug via INTRAVENOUS

## 2020-12-29 MED ORDER — CHLORHEXIDINE GLUCONATE 0.12 % MT SOLN
OROMUCOSAL | Status: AC
  Filled 2020-12-29: qty 15

## 2020-12-29 MED ORDER — PROPOFOL 10 MG/ML IV BOLUS
INTRAVENOUS | Status: AC
  Filled 2020-12-29: qty 20

## 2020-12-29 MED ORDER — ONDANSETRON HCL 4 MG/2ML IJ SOLN
INTRAMUSCULAR | Status: DC | PRN
  Administered 2020-12-29: 4 mg via INTRAVENOUS

## 2020-12-29 MED ORDER — FENTANYL CITRATE (PF) 100 MCG/2ML IJ SOLN: 25.0000 ug | INTRAMUSCULAR | Status: DC | PRN

## 2020-12-29 MED ORDER — LIDOCAINE HCL (CARDIAC) PF 50 MG/5ML IV SOSY
PREFILLED_SYRINGE | INTRAVENOUS | Status: DC | PRN
  Administered 2020-12-29: 80 mg via INTRAVENOUS

## 2020-12-29 MED ORDER — PROPOFOL 10 MG/ML IV BOLUS
INTRAVENOUS | Status: DC | PRN
  Administered 2020-12-29: 200 mg via INTRAVENOUS

## 2020-12-29 MED ORDER — ORAL CARE MOUTH RINSE: 15.0000 mL | Freq: Once | OROMUCOSAL | Status: AC

## 2020-12-29 MED ORDER — CEFAZOLIN SODIUM-DEXTROSE 2-4 GM/100ML-% IV SOLN
INTRAVENOUS | Status: AC
  Filled 2020-12-29: qty 100

## 2020-12-29 MED ORDER — MEPERIDINE HCL 50 MG/ML IJ SOLN: 6.2500 mg | INTRAMUSCULAR | Status: DC | PRN

## 2020-12-29 MED ORDER — ONDANSETRON HCL 4 MG/2ML IJ SOLN: 4.0000 mg | Freq: Once | INTRAMUSCULAR | Status: DC | PRN

## 2020-12-29 SURGICAL SUPPLY — 18 items
BAG DRAIN URO TABLE W/ADPT NS (BAG) ×2 IMPLANT
BAG DRN 8 ADPR NS SKTRN CSTL (BAG) ×1
BAG HAMPER (MISCELLANEOUS) ×2 IMPLANT
CLOTH BEACON ORANGE TIMEOUT ST (SAFETY) ×2 IMPLANT
GLOVE BIO SURGEON STRL SZ8 (GLOVE) ×2 IMPLANT
GLOVE SURG UNDER POLY LF SZ7 (GLOVE) ×4 IMPLANT
GOWN STRL REUS W/TWL LRG LVL3 (GOWN DISPOSABLE) ×2 IMPLANT
GOWN STRL REUS W/TWL XL LVL3 (GOWN DISPOSABLE) ×2 IMPLANT
IV NS IRRIG 3000ML ARTHROMATIC (IV SOLUTION) ×4 IMPLANT
KIT TURNOVER CYSTO (KITS) ×2 IMPLANT
MANIFOLD NEPTUNE II (INSTRUMENTS) ×2 IMPLANT
PACK CYSTO (CUSTOM PROCEDURE TRAY) ×2 IMPLANT
PAD ARMBOARD 7.5X6 YLW CONV (MISCELLANEOUS) ×2 IMPLANT
SYSTEM UROLIFT (Male Continence) ×12 IMPLANT
TOWEL OR 17X26 4PK STRL BLUE (TOWEL DISPOSABLE) ×2 IMPLANT
TRAY FOLEY W/BAG SLVR 16FR (SET/KITS/TRAYS/PACK) ×2
TRAY FOLEY W/BAG SLVR 16FR ST (SET/KITS/TRAYS/PACK) ×1 IMPLANT
WATER STERILE IRR 500ML POUR (IV SOLUTION) ×2 IMPLANT

## 2020-12-29 NOTE — H&P (Signed)
Urology Admission H&P  Chief Complaint: urinary retention  History of Present Illness: Mr Mccrystal is a 65yo with BPH and urinary retention who has failed alpha blocker therapy. He has severe LUTS on rapaflo 8mg  daily.  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[place])    Past Surgical History:  Procedure Laterality Date   COLONOSCOPY N/A    polyps    Home Medications:  Current Facility-Administered Medications  Medication Dose Route Frequency Provider Last Rate Last Admin   ceFAZolin (ANCEF) 2-4 GM/100ML-% IVPB            ceFAZolin (ANCEF) IVPB 2g/100 mL premix  2 g Intravenous 30 min Pre-Op Dujuan Stankowski, Candee Furbish, MD       chlorhexidine (PERIDEX) 0.12 % solution            lactated ringers infusion   Intravenous Continuous Denese Killings, MD 50 mL/hr at 12/29/20 1255 New Bag at 12/29/20 1255   Allergies: No Known Allergies  Family History  Problem Relation Age of Onset   COPD Father    Social History:  reports that he has been smoking cigarettes. He has a 13.00 pack-year smoking history. He has never used smokeless tobacco. He reports previous alcohol use. He reports that he does not use drugs.  Review of Systems  Genitourinary:  Positive for difficulty urinating, frequency and urgency.  All other systems reviewed and are negative.  Physical Exam:  Vital signs in last 24 hours: Temp:  [98.4 F (36.9 C)] 98.4 F (36.9 C) (06/20 1239) Pulse Rate:  [68] 68 (06/20 1239) Resp:  [18] 18 (06/20 1239) BP: (152-163)/(82-94) 152/82 (06/20 1257) SpO2:  [97 %-98 %] 98 % (06/20 1257) Weight:  [95.3 kg] 95.3 kg (06/20 1239) Physical Exam Constitutional:      Appearance: Normal appearance.  HENT:     Head: Normocephalic and atraumatic.     Nose: Nose normal. No congestion.     Mouth/Throat:     Mouth: Mucous membranes are dry.  Eyes:      Extraocular Movements: Extraocular movements intact.     Pupils: Pupils are equal, round, and reactive to light.  Cardiovascular:     Rate and Rhythm: Normal rate and regular rhythm.  Pulmonary:     Effort: Pulmonary effort is normal. No respiratory distress.  Abdominal:     General: Abdomen is flat. There is no distension.  Musculoskeletal:        General: No swelling. Normal range of motion.     Cervical back: Normal range of motion and neck supple.  Skin:    General: Skin is warm and dry.  Neurological:     General: No focal deficit present.     Mental Status: He is alert and oriented to person, place, and time.  Psychiatric:        Mood and Affect: Mood normal.        Behavior: Behavior normal.        Thought Content: Thought content normal.        Judgment: Judgment normal.    Laboratory Data:  No results found for this or any previous visit (from the past 24 hour(s)). No results found for this or any previous visit (from the past 240 hour(s)). Creatinine: Recent Labs    12/25/20 1103  CREATININE 0.83  Baseline Creatinine: 0.8  Impression/Assessment:  65yo with BPH and urinary retention  Plan:  We discussed the management of his BPH including continued medical therapy, Rezum, Urolift, TURP and simple prostatectomy. After discussing the options the patient has elected to proceed with Urolift. Risks/benefits/alternatives discussed.   Nicolette Bang 12/29/2020, 1:30 PM

## 2020-12-29 NOTE — Anesthesia Postprocedure Evaluation (Signed)
Anesthesia Post Note  Patient: Joseph Duffy  Procedure(s) Performed: CYSTOSCOPY WITH INSERTION OF UROLIFT  Patient location during evaluation: PACU Anesthesia Type: General Level of consciousness: awake and alert and oriented Pain management: pain level controlled Vital Signs Assessment: post-procedure vital signs reviewed and stable Respiratory status: spontaneous breathing Cardiovascular status: blood pressure returned to baseline and stable Postop Assessment: no apparent nausea or vomiting Anesthetic complications: no   No notable events documented.   Last Vitals:  Vitals:   12/29/20 1500 12/29/20 1516  BP: (!) 157/80   Pulse: 64 62  Resp: 15 16  Temp:  36.4 C  SpO2: 99% 97%    Last Pain:  Vitals:   12/29/20 1516  TempSrc:   PainSc: 3                  Talana Slatten C Akari [employer]

## 2020-12-29 NOTE — Anesthesia Procedure Notes (Signed)
Procedure Name: LMA Insertion Date/Time: 12/29/2020 4:07 AM Performed by: Ollen Bowl, CRNA Pre-anesthesia Checklist: Patient identified, Patient being monitored, Emergency Drugs available, Timeout performed and Suction available Patient Re-evaluated:Patient Re-evaluated prior to induction Oxygen Delivery Method: Circle System Utilized Preoxygenation: Pre-oxygenation with 100% oxygen Induction Type: IV induction Ventilation: Mask ventilation without difficulty LMA: LMA inserted LMA Size: 4.0 Number of attempts: 1 Placement Confirmation: positive ETCO2 and breath sounds checked- equal and bilateral

## 2020-12-29 NOTE — Transfer of Care (Signed)
Immediate Anesthesia Transfer of Care Note  Patient: Joseph Duffy  Procedure(s) Performed: CYSTOSCOPY WITH INSERTION OF UROLIFT  Patient Location: PACU  Anesthesia Type:General  Level of Consciousness: awake and alert   Airway & Oxygen Therapy: Patient Spontanous Breathing and Patient connected to face mask oxygen  Post-op Assessment: Report given to RN and Post -op Vital signs reviewed and stable  Post vital signs: Reviewed and stable  Last Vitals:  Vitals Value Taken Time  BP 155/87 12/29/20 1435  Temp    Pulse 77 12/29/20 1437  Resp 17 12/29/20 1437  SpO2 100 % 12/29/20 1437  Vitals shown include unvalidated device data.  Last Pain:  Vitals:   12/29/20 1239  TempSrc: Oral  PainSc: 5       Patients Stated Pain Goal: 5 (40/08/67 6195)  Complications: No notable events documented.

## 2020-12-29 NOTE — Anesthesia Preprocedure Evaluation (Addendum)
Anesthesia Evaluation  Patient identified by MRN, date of birth, ID band Patient awake    Reviewed: Allergy & Precautions, NPO status , Patient's Chart, lab work & pertinent test results  History of Anesthesia Complications Negative for: history of anesthetic complications  Airway Mallampati: II  TM Distance: >3 FB Neck ROM: Full    Dental  (+) Dental Advisory Given, Missing, Chipped,    Pulmonary Current Smoker and Patient abstained from smoking.,    Pulmonary exam normal breath sounds clear to auscultation       Cardiovascular Exercise Tolerance: Good hypertension, Pt. on medications Normal cardiovascular exam Rhythm:Regular Rate:Normal  25-Dec-2020 10:59:13 [place] [facility] ROUTINE RECORD [birth year] (51 yr) Male Caucasian Room: Loc:905 Technician: Test ind: Vent. rate 69 BPM PR interval 152 ms QRS duration 98 ms QT/QTcB 408/437 ms P-R-T axes 26 -66 -6 Normal sinus rhythm Pulmonary disease pattern Left anterior fascicular block Abnormal ECG Confirmed by Asencion Noble [phone]) on 12/25/2020 12:28:48 PM   Neuro/Psych  Neuromuscular disease (Bell's palsy)    GI/Hepatic negative GI ROS, (+) Cirrhosis       , Hepatitis -, C  Endo/Other  negative endocrine ROS  Renal/GU negative Renal ROS Bladder dysfunction (BPH)      Musculoskeletal negative musculoskeletal ROS (+)   Abdominal   Peds  Hematology negative hematology ROS (+)   Anesthesia Other Findings Ataxia    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[place])   Reproductive/Obstetrics negative OB ROS                             Anesthesia Physical Anesthesia Plan  ASA: 2  Anesthesia Plan: General   Post-op Pain Management:    Induction: Intravenous  PONV Risk Score and Plan: Propofol infusion  Airway Management Planned: LMA  Additional Equipment:   Intra-op Plan:   Post-operative Plan: Extubation in OR  Informed  Consent: I have reviewed the patients History and Physical, chart, labs and discussed the procedure including the risks, benefits and alternatives for the proposed anesthesia with the patient or authorized representative who has indicated his/her understanding and acceptance.     Dental advisory given  Plan Discussed with: CRNA and Surgeon  Anesthesia Plan Comments:        Anesthesia Quick Evaluation

## 2020-12-29 NOTE — Op Note (Signed)
   PREOPERATIVE DIAGNOSIS:  Benign prostatic hypertrophy with bladder outlet obstruction.  POSTOPERATIVE DIAGNOSIS:  Benign prostatic hypertrophy with bladder outlet obstruction.  PROCEDURE:  Cystoscopy with implantation of UroLift devices, 6 implants.  SURGEON:  Nicolette Bang, M.D.  ANESTHESIA:  General  ANTIBIOTICS: ancef  SPECIMEN:  None.  DRAINS:  A 16-French Foley catheter.  BLOOD LOSS:  Minimal.  COMPLICATIONS:  None.  INDICATIONS: The Patient is an 65 year old male with BPH and bladder outlet obstruction.  He has failed medical therapy and has elected UroLift for definitive treatment.  FINDINGS OF PROCEDURE:  He was taken to the operating room where a genral anesthetic was induced.  He was placed in lithotomy position and was fitted with PAS hose.  His perineum and genitalia were prepped with chlorhexidine, and he was draped in usual sterile fashion.  Cystoscopy was performed using the UroLift scope and 0 degree lens. Examination revealed a normal urethra.  The external sphincter was intact.  Prostatic urethra was approximately 5 cm in length with lateral lobe enlargement. There was also little bit of bladder neck elevation. Inspection of bladder revealed mild-to-moderate trabeculation with no tumors, stones, or inflammation.  No cellules or diverticula were noted. Ureteral orifices were in their normal anatomic position effluxing clear urine.  After initial cystoscopy, the visual obturator was replaced with the first UroLift device.  This was turned to the 9 o'clock position and pulled back to the veru and then slightly advanced.  Pressure was then applied to the right lateral lobe and the UroLift device was deployed.  The second UroLift device was then inserted and applied to the left lateral lobe at 3 o'clock and deployed in the mid prostatic urethra. After this, there was still some apparent obstruction closer to the bladder neck.  So a second  and  third level of UroLift your left device was applied between the mid urethra and the proximal urethra providing further patency to the prostatic urethra.  At this point a Foley catheter was indicated.  The scope was removed and a 16-French Foley catheter was inserted without difficulty. The balloon was filled with 10 mL sterile fluid, and the catheter was placed to straight drainage.  COMPLICATIONS: None   CONDITION: Stable, extubated, transferred to PACU  PLAN: The patient will be discharged home and followup in 2 days for a voiding trial.

## 2020-12-30 ENCOUNTER — Encounter (HOSPITAL_COMMUNITY): Payer: Self-pay | Admitting: Urology

## 2020-12-31 ENCOUNTER — Other Ambulatory Visit: Payer: Self-pay

## 2020-12-31 ENCOUNTER — Ambulatory Visit (INDEPENDENT_AMBULATORY_CARE_PROVIDER_SITE_OTHER): Payer: Medicare Other | Admitting: Urology

## 2020-12-31 DIAGNOSIS — N138 Other obstructive and reflux uropathy: Secondary | ICD-10-CM

## 2020-12-31 DIAGNOSIS — N401 Enlarged prostate with lower urinary tract symptoms: Secondary | ICD-10-CM | POA: Diagnosis not present

## 2020-12-31 NOTE — Progress Notes (Signed)
Patient returned this afternoon for PVR post fill/pull/flow.  PVR 312. Patient will return Friday for NV PVR

## 2020-12-31 NOTE — Progress Notes (Signed)
Fill and Pull Catheter Removal  Patient is present today for a catheter removal.  Patient was cleaned and prepped in a sterile fashion 33ml of sterile water/ saline was instilled into the bladder when the patient felt the urge to urinate. 17ml of water was then drained from the balloon.  A 16FR foley cath was removed from the bladder no complications were noted .  Patient as then given some time to void on their own.  Patient can void  32ml on their own after some time.  Patient tolerated well.  Performed by: Estill Bamberg RN  Follow up/ Additional notes: return this afternoon for PVR check per Dr. Alyson Ingles

## 2021-01-01 ENCOUNTER — Ambulatory Visit: Payer: Medicare Other | Admitting: Physical Therapy

## 2021-01-02 ENCOUNTER — Ambulatory Visit (INDEPENDENT_AMBULATORY_CARE_PROVIDER_SITE_OTHER): Payer: Medicare Other

## 2021-01-02 ENCOUNTER — Other Ambulatory Visit: Payer: Self-pay

## 2021-01-02 DIAGNOSIS — R339 Retention of urine, unspecified: Secondary | ICD-10-CM

## 2021-01-02 LAB — BLADDER SCAN AMB NON-IMAGING: Scan Result: 212

## 2021-01-02 NOTE — Progress Notes (Signed)
post void residual=212

## 2021-01-19 DIAGNOSIS — S335XXA Sprain of ligaments of lumbar spine, initial encounter: Secondary | ICD-10-CM | POA: Diagnosis not present

## 2021-01-19 DIAGNOSIS — M9903 Segmental and somatic dysfunction of lumbar region: Secondary | ICD-10-CM | POA: Diagnosis not present

## 2021-01-19 DIAGNOSIS — M5441 Lumbago with sciatica, right side: Secondary | ICD-10-CM | POA: Diagnosis not present

## 2021-01-21 DIAGNOSIS — M5441 Lumbago with sciatica, right side: Secondary | ICD-10-CM | POA: Diagnosis not present

## 2021-01-21 DIAGNOSIS — M9903 Segmental and somatic dysfunction of lumbar region: Secondary | ICD-10-CM | POA: Diagnosis not present

## 2021-01-21 DIAGNOSIS — S335XXA Sprain of ligaments of lumbar spine, initial encounter: Secondary | ICD-10-CM | POA: Diagnosis not present

## 2021-01-27 DIAGNOSIS — S335XXA Sprain of ligaments of lumbar spine, initial encounter: Secondary | ICD-10-CM | POA: Diagnosis not present

## 2021-01-27 DIAGNOSIS — M5441 Lumbago with sciatica, right side: Secondary | ICD-10-CM | POA: Diagnosis not present

## 2021-01-27 DIAGNOSIS — M9903 Segmental and somatic dysfunction of lumbar region: Secondary | ICD-10-CM | POA: Diagnosis not present

## 2021-01-29 DIAGNOSIS — S335XXA Sprain of ligaments of lumbar spine, initial encounter: Secondary | ICD-10-CM | POA: Diagnosis not present

## 2021-01-29 DIAGNOSIS — M9903 Segmental and somatic dysfunction of lumbar region: Secondary | ICD-10-CM | POA: Diagnosis not present

## 2021-01-29 DIAGNOSIS — M5441 Lumbago with sciatica, right side: Secondary | ICD-10-CM | POA: Diagnosis not present

## 2021-02-02 ENCOUNTER — Encounter: Payer: Self-pay | Admitting: Urology

## 2021-02-02 ENCOUNTER — Ambulatory Visit (INDEPENDENT_AMBULATORY_CARE_PROVIDER_SITE_OTHER): Payer: Medicare Other | Admitting: Urology

## 2021-02-02 ENCOUNTER — Other Ambulatory Visit: Payer: Self-pay

## 2021-02-02 VITALS — BP 165/67 | HR 67

## 2021-02-02 DIAGNOSIS — N401 Enlarged prostate with lower urinary tract symptoms: Secondary | ICD-10-CM | POA: Diagnosis not present

## 2021-02-02 DIAGNOSIS — N138 Other obstructive and reflux uropathy: Secondary | ICD-10-CM | POA: Diagnosis not present

## 2021-02-02 DIAGNOSIS — R339 Retention of urine, unspecified: Secondary | ICD-10-CM

## 2021-02-02 LAB — URINALYSIS, ROUTINE W REFLEX MICROSCOPIC
Bilirubin, UA: NEGATIVE
Glucose, UA: NEGATIVE
Ketones, UA: NEGATIVE
Leukocytes,UA: NEGATIVE
Nitrite, UA: NEGATIVE
Protein,UA: NEGATIVE
Specific Gravity, UA: 1.02 (ref 1.005–1.030)
Urobilinogen, Ur: 1 mg/dL (ref 0.2–1.0)
pH, UA: 6.5 (ref 5.0–7.5)

## 2021-02-02 LAB — BLADDER SCAN AMB NON-IMAGING: Scan Result: 117

## 2021-02-02 LAB — MICROSCOPIC EXAMINATION
Bacteria, UA: NONE SEEN
Epithelial Cells (non renal): NONE SEEN /hpf (ref 0–10)
RBC, Urine: NONE SEEN /hpf (ref 0–2)
Renal Epithel, UA: NONE SEEN /hpf
WBC, UA: NONE SEEN /hpf (ref 0–5)

## 2021-02-02 NOTE — Patient Instructions (Signed)
Benign Prostatic Hyperplasia  Benign prostatic hyperplasia (BPH) is an enlarged prostate gland that is caused by the normal aging process and not by cancer. The prostate is a walnut-sized gland that is involved in the production of semen. It is located in front of the rectum and below the bladder. The bladder stores urine and the urethra is the tube that carries the urine out of the body. The prostate may get bigger asa man gets older. An enlarged prostate can press on the urethra. This can make it harder to pass urine. The build-up of urine in the bladder can cause infection. Back pressure and infection may progress to bladder damage and kidney (renal) failure. What are the causes? This condition is part of a normal aging process. However, not all men develop problems from this condition. If the prostate enlarges away from the urethra, urine flow will not be blocked. If it enlarges toward the urethra andcompresses it, there will be problems passing urine. What increases the risk? This condition is more likely to develop in men over the age of 50 years. What are the signs or symptoms? Symptoms of this condition include: Getting up often during the night to urinate. Needing to urinate frequently during the day. Difficulty starting urine flow. Decrease in size and strength of your urine stream. Leaking (dribbling) after urinating. Inability to pass urine. This needs immediate treatment. Inability to completely empty your bladder. Pain when you pass urine. This is more common if there is also an infection. Urinary tract infection (UTI). How is this diagnosed? This condition is diagnosed based on your medical history, a physical exam, and your symptoms. Tests will also be done, such as: A post-void bladder scan. This measures any amount of urine that may remain in your bladder after you finish urinating. A digital rectal exam. In a rectal exam, your health care provider checks your prostate by  putting a lubricated, gloved finger into your rectum to feel the back of your prostate gland. This exam detects the size of your gland and any abnormal lumps or growths. An exam of your urine (urinalysis). A prostate specific antigen (PSA) screening. This is a blood test used to screen for prostate cancer. An ultrasound. This test uses sound waves to electronically produce a picture of your prostate gland. Your health care provider may refer you to a specialist in kidney and prostate diseases (urologist). How is this treated? Once symptoms begin, your health care provider will monitor your condition (active surveillance or watchful waiting). Treatment for this condition will depend on the severity of your condition. Treatment may include: Observation and yearly exams. This may be the only treatment needed if your condition and symptoms are mild. Medicines to relieve your symptoms, including: Medicines to shrink the prostate. Medicines to relax the muscle of the prostate. Surgery in severe cases. Surgery may include: Prostatectomy. In this procedure, the prostate tissue is removed completely through an open incision or with a laparoscope or robotics. Transurethral resection of the prostate (TURP). In this procedure, a tool is inserted through the opening at the tip of the penis (urethra). It is used to cut away tissue of the inner core of the prostate. The pieces are removed through the same opening of the penis. This removes the blockage. Transurethral incision (TUIP). In this procedure, small cuts are made in the prostate. This lessens the prostate's pressure on the urethra. Transurethral microwave thermotherapy (TUMT). This procedure uses microwaves to create heat. The heat destroys and removes a small   amount of prostate tissue. Transurethral needle ablation (TUNA). This procedure uses radio frequencies to destroy and remove a small amount of prostate tissue. Interstitial laser coagulation (ILC).  This procedure uses a laser to destroy and remove a small amount of prostate tissue. Transurethral electrovaporization (TUVP). This procedure uses electrodes to destroy and remove a small amount of prostate tissue. Prostatic urethral lift. This procedure inserts an implant to push the lobes of the prostate away from the urethra. Follow these instructions at home: Take over-the-counter and prescription medicines only as told by your health care provider. Monitor your symptoms for any changes. Contact your health care provider with any changes. Avoid drinking large amounts of liquid before going to bed or out in public. Avoid or reduce how much caffeine or alcohol you drink. Give yourself time when you urinate. Keep all follow-up visits as told by your health care provider. This is important. Contact a health care provider if: You have unexplained back pain. Your symptoms do not get better with treatment. You develop side effects from the medicine you are taking. Your urine becomes very dark or has a bad smell. Your lower abdomen becomes distended and you have trouble passing your urine. Get help right away if: You have a fever or chills. You suddenly cannot urinate. You feel lightheaded, or very dizzy, or you faint. There are large amounts of blood or clots in the urine. Your urinary problems become hard to manage. You develop moderate to severe low back or flank pain. The flank is the side of your body between the ribs and the hip. These symptoms may represent a serious problem that is an emergency. Do not wait to see if the symptoms will go away. Get medical help right away. Call your local emergency services (911 in the [place] Do not drive yourself to the hospital. Summary Benign prostatic hyperplasia (BPH) is an enlarged prostate that is caused by the normal aging process and not by cancer. An enlarged prostate can press on the urethra. This can make it hard to pass urine. This  condition is part of a normal aging process and is more likely to develop in men over the age of 50 years. Get help right away if you suddenly cannot urinate. This information is not intended to replace advice given to you by your health care provider. Make sure you discuss any questions you have with your healthcare provider. Document Revised: 03/06/2020 Document Reviewed: 03/06/2020 Elsevier Patient Education  2022 Elsevier Inc.  

## 2021-02-02 NOTE — Progress Notes (Signed)
post void residual=117  Urological Symptom Review  Patient is experiencing the following symptoms: none   Review of Systems  Gastrointestinal (upper)  : Negative for upper GI symptoms  Gastrointestinal (lower) : Negative for lower GI symptoms  Constitutional : Negative for symptoms  Skin: Negative for skin symptoms  Eyes: Negative for eye symptoms  Ear/Nose/Throat : Negative for Ear/Nose/Throat symptoms  Hematologic/Lymphatic: Negative for Hematologic/Lymphatic symptoms  Cardiovascular : Negative for cardiovascular symptoms  Respiratory : Negative for respiratory symptoms  Endocrine: Negative for endocrine symptoms  Musculoskeletal: Negative for musculoskeletal symptoms  Neurological: Negative for neurological symptoms  Psychologic: Negative for psychiatric symptoms

## 2021-02-02 NOTE — Progress Notes (Signed)
02/02/2021 3:51 PM   Joseph Duffy 1956-07-11 [id number]  Referring provider: Glenda Chroman, MD [place],  [place] [id number]  [place] BPH  HPI: Joseph Duffy is a 65yo here for followup for BPH and incomplete emptying. PVR 117. IPSS 7 QOL 2. He has urgency and urge incontinence when he wakes up in the morning. No other complaints today.    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 If you have any questions, ask your nurse or doctor.          acetaminophen-codeine 300-30 MG tablet Commonly known as: TYLENOL #3 Take 1 tablet by mouth every 6 (six) hours as needed.   amoxicillin 500 MG tablet Commonly known as: AMOXIL Take 500 mg by mouth 3 (three) times daily.   ibuprofen 800 MG tablet Commonly known as: ADVIL Take 800 mg by mouth every 8 (eight) hours as needed.   multivitamin with minerals tablet Take 1 tablet by mouth daily.   silodosin 8 MG Caps capsule Commonly known as: RAPAFLO Take 1 capsule (8 mg total) by mouth daily with breakfast.   traMADol 50 MG tablet Commonly known as: Ultram Take 1 tablet (50 mg total) by mouth every 6 (six) hours as needed.   Visine 0.025-0.3 % ophthalmic solution Generic drug: naphazoline-pheniramine Place 1 drop into both eyes 4 (four) times daily as needed for eye irritation.        Allergies: No Known Allergies  Family  History: Family History  Problem Relation Age of Onset   COPD Father     Social History:  reports that he has been smoking cigarettes. He has a 13.00 pack-year smoking history. He has never used smokeless tobacco. He reports previous alcohol use. He reports that he does not use drugs.  ROS: All other review of systems were reviewed and are negative except what is noted above in HPI  Physical Exam: BP (!) 165/67   Pulse 67   Constitutional:  Alert and oriented, No acute distress. HEENT: [place] AT, moist mucus membranes.  Trachea midline, no masses. Cardiovascular: No clubbing, cyanosis, or edema. Respiratory: Normal respiratory effort, no increased work of breathing. GI: Abdomen is soft, nontender, nondistended, no abdominal masses GU: No CVA tenderness.  Lymph: No cervical or inguinal lymphadenopathy. Skin: No rashes, bruises or suspicious lesions. Neurologic: Grossly intact, no focal deficits, moving all 4 extremities. Psychiatric: Normal mood and affect.  Laboratory Data: Lab Results  Component Value Date   WBC 7.1 12/25/2020   HGB 15.1 12/25/2020   HCT 44.5 12/25/2020   MCV 96.1 12/25/2020   PLT 183 12/25/2020    Lab Results  Component Value Date   CREATININE 0.83 12/25/2020    No results found for: PSA  No results found for:  TESTOSTERONE  Lab Results  Component Value Date   HGBA1C 5.4 11/20/2019    Urinalysis    Component Value Date/Time   COLORURINE YELLOW 04/06/2016 1336   APPEARANCEUR Clear 02/02/2021 1536   LABSPEC 1.025 04/06/2016 1336   PHURINE 5.5 04/06/2016 1336   GLUCOSEU Negative 02/02/2021 1536   HGBUR NEGATIVE 04/06/2016 1336   BILIRUBINUR Negative 02/02/2021 1536   KETONESUR NEGATIVE 04/06/2016 1336   PROTEINUR Negative 02/02/2021 1536   PROTEINUR NEGATIVE 04/06/2016 1336   NITRITE Negative 02/02/2021 1536   NITRITE NEGATIVE 04/06/2016 1336   LEUKOCYTESUR Negative 02/02/2021 1536    Lab Results  Component Value Date   LABMICR See  below: 02/02/2021   WBCUA None seen 02/02/2021   LABEPIT None seen 02/02/2021   BACTERIA None seen 02/02/2021    Pertinent Imaging:  No results found for this or any previous visit.  No results found for this or any previous visit.  No results found for this or any previous visit.  No results found for this or any previous visit.  No results found for this or any previous visit.  No results found for this or any previous visit.  No results found for this or any previous visit.  No results found for this or any previous visit.   Assessment & Plan:    1. Incomplete emptying of bladder -resolved. RTC 6 months - BLADDER SCAN AMB NON-IMAGING - Urinalysis, Routine w reflex microscopic  2. Benign prostatic hyperplasia with urinary obstruction -RTC 6 months    No follow-ups on file.  Nicolette Bang, MD  [facility] [place]

## 2021-02-03 DIAGNOSIS — M9903 Segmental and somatic dysfunction of lumbar region: Secondary | ICD-10-CM | POA: Diagnosis not present

## 2021-02-03 DIAGNOSIS — M5441 Lumbago with sciatica, right side: Secondary | ICD-10-CM | POA: Diagnosis not present

## 2021-02-03 DIAGNOSIS — S335XXA Sprain of ligaments of lumbar spine, initial encounter: Secondary | ICD-10-CM | POA: Diagnosis not present

## 2021-04-21 DIAGNOSIS — I10 Essential (primary) hypertension: Secondary | ICD-10-CM | POA: Diagnosis not present

## 2021-04-21 DIAGNOSIS — Z299 Encounter for prophylactic measures, unspecified: Secondary | ICD-10-CM | POA: Diagnosis not present

## 2021-04-21 DIAGNOSIS — R0683 Snoring: Secondary | ICD-10-CM | POA: Diagnosis not present

## 2021-04-22 ENCOUNTER — Other Ambulatory Visit: Payer: Self-pay

## 2021-04-22 ENCOUNTER — Encounter: Payer: Self-pay | Admitting: Urology

## 2021-04-22 ENCOUNTER — Ambulatory Visit (INDEPENDENT_AMBULATORY_CARE_PROVIDER_SITE_OTHER): Payer: Medicare Other | Admitting: Urology

## 2021-04-22 VITALS — BP 151/72 | HR 73

## 2021-04-22 DIAGNOSIS — N401 Enlarged prostate with lower urinary tract symptoms: Secondary | ICD-10-CM | POA: Diagnosis not present

## 2021-04-22 DIAGNOSIS — N138 Other obstructive and reflux uropathy: Secondary | ICD-10-CM

## 2021-04-22 DIAGNOSIS — R351 Nocturia: Secondary | ICD-10-CM

## 2021-04-22 DIAGNOSIS — R339 Retention of urine, unspecified: Secondary | ICD-10-CM | POA: Diagnosis not present

## 2021-04-22 LAB — URINALYSIS, ROUTINE W REFLEX MICROSCOPIC
Bilirubin, UA: NEGATIVE
Glucose, UA: NEGATIVE
Ketones, UA: NEGATIVE
Leukocytes,UA: NEGATIVE
Nitrite, UA: NEGATIVE
Protein,UA: NEGATIVE
RBC, UA: NEGATIVE
Specific Gravity, UA: 1.02 (ref 1.005–1.030)
Urobilinogen, Ur: 0.2 mg/dL (ref 0.2–1.0)
pH, UA: 6 (ref 5.0–7.5)

## 2021-04-22 LAB — BLADDER SCAN AMB NON-IMAGING: Scan Result: 228

## 2021-04-22 MED ORDER — MIRABEGRON ER 25 MG PO TB24: 25.0000 mg | ORAL_TABLET | Freq: Every day | ORAL | 0 refills | Status: AC

## 2021-04-22 NOTE — Progress Notes (Signed)
post void residual=228 Urological Symptom Review  Patient is experiencing the following symptoms: Leakage of urine   Review of Systems  Gastrointestinal (upper)  : Negative for upper GI symptoms  Gastrointestinal (lower) : Negative for lower GI symptoms  Constitutional : Negative for symptoms  Skin: Negative for skin symptoms  Eyes: Negative for eye symptoms  Ear/Nose/Throat : Negative for Ear/Nose/Throat symptoms  Hematologic/Lymphatic: Negative for Hematologic/Lymphatic symptoms  Cardiovascular : Negative for cardiovascular symptoms  Respiratory : Negative for respiratory symptoms  Endocrine: Negative for endocrine symptoms  Musculoskeletal: Negative for musculoskeletal symptoms  Neurological: Negative for neurological symptoms  Psychologic: Negative for psychiatric symptoms

## 2021-04-22 NOTE — Patient Instructions (Signed)
Benign Prostatic Hyperplasia Benign prostatic hyperplasia (BPH) is an enlarged prostate gland that is caused by the normal aging process and not by cancer. The prostate is a walnut-sized gland that is involved in the production of semen. It is located in front of the rectum and below the bladder. The bladder stores urine and the urethra is the tube that carries the urine out of the body. The prostate may get bigger as a man gets older. An enlarged prostate can press on the urethra. This can make it harder to pass urine. The build-up of urine in the bladder can cause infection. Back pressure and infection may progress to bladder damage and kidney (renal) failure. What are the causes? This condition is part of a normal aging process. However, not all men develop problems from this condition. If the prostate enlarges away from the urethra, urine flow will not be blocked. If it enlarges toward the urethra and compresses it, there will be problems passing urine. What increases the risk? This condition is more likely to develop in men over the age of 50 years. What are the signs or symptoms? Symptoms of this condition include: Getting up often during the night to urinate. Needing to urinate frequently during the day. Difficulty starting urine flow. Decrease in size and strength of your urine stream. Leaking (dribbling) after urinating. Inability to pass urine. This needs immediate treatment. Inability to completely empty your bladder. Pain when you pass urine. This is more common if there is also an infection. Urinary tract infection (UTI). How is this diagnosed? This condition is diagnosed based on your medical history, a physical exam, and your symptoms. Tests will also be done, such as: A post-void bladder scan. This measures any amount of urine that may remain in your bladder after you finish urinating. A digital rectal exam. In a rectal exam, your health care provider checks your prostate by  putting a lubricated, gloved finger into your rectum to feel the back of your prostate gland. This exam detects the size of your gland and any abnormal lumps or growths. An exam of your urine (urinalysis). A prostate specific antigen (PSA) screening. This is a blood test used to screen for prostate cancer. An ultrasound. This test uses sound waves to electronically produce a picture of your prostate gland. Your health care provider may refer you to a specialist in kidney and prostate diseases (urologist). How is this treated? Once symptoms begin, your health care provider will monitor your condition (active surveillance or watchful waiting). Treatment for this condition will depend on the severity of your condition. Treatment may include: Observation and yearly exams. This may be the only treatment needed if your condition and symptoms are mild. Medicines to relieve your symptoms, including: Medicines to shrink the prostate. Medicines to relax the muscle of the prostate. Surgery in severe cases. Surgery may include: Prostatectomy. In this procedure, the prostate tissue is removed completely through an open incision or with a laparoscope or robotics. Transurethral resection of the prostate (TURP). In this procedure, a tool is inserted through the opening at the tip of the penis (urethra). It is used to cut away tissue of the inner core of the prostate. The pieces are removed through the same opening of the penis. This removes the blockage. Transurethral incision (TUIP). In this procedure, small cuts are made in the prostate. This lessens the prostate's pressure on the urethra. Transurethral microwave thermotherapy (TUMT). This procedure uses microwaves to create heat. The heat destroys and removes a   small amount of prostate tissue. Transurethral needle ablation (TUNA). This procedure uses radio frequencies to destroy and remove a small amount of prostate tissue. Interstitial laser coagulation (ILC).  This procedure uses a laser to destroy and remove a small amount of prostate tissue. Transurethral electrovaporization (TUVP). This procedure uses electrodes to destroy and remove a small amount of prostate tissue. Prostatic urethral lift. This procedure inserts an implant to push the lobes of the prostate away from the urethra. Follow these instructions at home: Take over-the-counter and prescription medicines only as told by your health care provider. Monitor your symptoms for any changes. Contact your health care provider with any changes. Avoid drinking large amounts of liquid before going to bed or out in public. Avoid or reduce how much caffeine or alcohol you drink. Give yourself time when you urinate. Keep all follow-up visits as told by your health care provider. This is important. Contact a health care provider if: You have unexplained back pain. Your symptoms do not get better with treatment. You develop side effects from the medicine you are taking. Your urine becomes very dark or has a bad smell. Your lower abdomen becomes distended and you have trouble passing your urine. Get help right away if: You have a fever or chills. You suddenly cannot urinate. You feel lightheaded, or very dizzy, or you faint. There are large amounts of blood or clots in the urine. Your urinary problems become hard to manage. You develop moderate to severe low back or flank pain. The flank is the side of your body between the ribs and the hip. These symptoms may represent a serious problem that is an emergency. Do not wait to see if the symptoms will go away. Get medical help right away. Call your local emergency services (911 in the [place] Do not drive yourself to the hospital. Summary Benign prostatic hyperplasia (BPH) is an enlarged prostate that is caused by the normal aging process and not by cancer. An enlarged prostate can press on the urethra. This can make it hard to pass urine. This  condition is part of a normal aging process and is more likely to develop in men over the age of 50 years. Get help right away if you suddenly cannot urinate. This information is not intended to replace advice given to you by your health care provider. Make sure you discuss any questions you have with your health care provider. Document Revised: 10/08/2020 Document Reviewed: 03/06/2020 Elsevier Patient Education  2022 Elsevier Inc.  

## 2021-04-22 NOTE — Progress Notes (Signed)
04/22/2021 3:46 PM   Joseph Duffy 1956/06/29 [id number]  Referring provider: Glenda Chroman, MD [place],  [place] [address]  nocturia   HPI: Joseph Duffy is a 65yo here for followup for BPH and with new onset nocturia. He underwent Urolift 12/2020. His daytime urinary frequency and urine stream have improved significantly. He has new nocturia nocturnal enuresis. Nocturia 1x.  IPSS 5 QOl 4 due to nocturnal enuresis. PVR 228. 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 If you have any questions, ask your nurse or doctor.          acetaminophen-codeine 300-30 MG tablet Commonly known as: TYLENOL #3 Take 1 tablet by mouth every 6 (six) hours as needed.   amoxicillin 500 MG tablet Commonly known as: AMOXIL Take 500 mg by mouth 3 (three) times daily.   ibuprofen 800 MG tablet Commonly known as: ADVIL Take 800 mg by mouth every 8 (eight) hours as needed.   multivitamin with minerals tablet Take 1 tablet by mouth daily.   silodosin 8 MG Caps capsule Commonly known as: RAPAFLO Take 1 capsule (8 mg total) by mouth daily with breakfast.   traMADol 50 MG tablet Commonly known as: Ultram Take 1 tablet (50 mg total) by mouth every 6 (six) hours as needed.   Visine 0.025-0.3 % ophthalmic solution Generic drug: naphazoline-pheniramine Place 1 drop into both eyes  4 (four) times daily as needed for eye irritation.        Allergies: No Known Allergies  Family History: Family History  Problem Relation Age of Onset   COPD Father     Social History:  reports that he has been smoking cigarettes. He has a 13.00 pack-year smoking history. He has never used smokeless tobacco. He reports that he does not currently use alcohol. He reports that he does not use drugs.  ROS: All other review of systems were reviewed and are negative except what is noted above in HPI  Physical Exam: BP (!) 151/72   Pulse 73   Constitutional:  Alert and oriented, No acute distress. HEENT: [place] AT, moist mucus membranes.  Trachea midline, no masses. Cardiovascular: No clubbing, cyanosis, or edema. Respiratory: Normal respiratory effort, no increased work of breathing. GI: Abdomen is soft, nontender, nondistended, no abdominal masses GU: No CVA tenderness.  Lymph: No cervical or inguinal lymphadenopathy. Skin: No rashes, bruises or suspicious lesions. Neurologic: Grossly intact, no focal deficits, moving all 4 extremities. Psychiatric: Normal mood and affect.  Laboratory Data: Lab Results  Component Value Date   WBC 7.1 12/25/2020   HGB 15.1 12/25/2020   HCT 44.5 12/25/2020   MCV 96.1 12/25/2020   PLT 183 12/25/2020    Lab Results  Component Value  Date   CREATININE 0.83 12/25/2020    No results found for: PSA  No results found for: TESTOSTERONE  Lab Results  Component Value Date   HGBA1C 5.4 11/20/2019    Urinalysis    Component Value Date/Time   COLORURINE YELLOW 04/06/2016 1336   APPEARANCEUR Clear 02/02/2021 1536   LABSPEC 1.025 04/06/2016 1336   PHURINE 5.5 04/06/2016 1336   GLUCOSEU Negative 02/02/2021 1536   HGBUR NEGATIVE 04/06/2016 1336   BILIRUBINUR Negative 02/02/2021 1536   [place] 04/06/2016 1336   PROTEINUR Negative 02/02/2021 1536   PROTEINUR NEGATIVE 04/06/2016 1336   NITRITE Negative 02/02/2021 1536   NITRITE  NEGATIVE 04/06/2016 1336   LEUKOCYTESUR Negative 02/02/2021 1536    Lab Results  Component Value Date   LABMICR See below: 02/02/2021   WBCUA None seen 02/02/2021   LABEPIT None seen 02/02/2021   BACTERIA None seen 02/02/2021    Pertinent Imaging:  No results found for this or any previous visit.  No results found for this or any previous visit.  No results found for this or any previous visit.  No results found for this or any previous visit.  No results found for this or any previous visit.  No results found for this or any previous visit.  No results found for this or any previous visit.  No results found for this or any previous visit.   Assessment & Plan:    1. Incomplete emptying of bladder -double voiding  - BLADDER SCAN AMB NON-IMAGING  2. Benign prostatic hyperplasia with urinary obstruction -Improved after urolift - Urinalysis, Routine w reflex microscopic  3. Nocturia -mirabegron 25mg  QHS   No follow-ups on file.  Nicolette Bang, MD  [facility] [place]

## 2021-05-08 DIAGNOSIS — G4730 Sleep apnea, unspecified: Secondary | ICD-10-CM | POA: Diagnosis not present

## 2021-05-14 DIAGNOSIS — R2689 Other abnormalities of gait and mobility: Secondary | ICD-10-CM | POA: Diagnosis not present

## 2021-05-18 DIAGNOSIS — R2689 Other abnormalities of gait and mobility: Secondary | ICD-10-CM | POA: Diagnosis not present

## 2021-05-21 DIAGNOSIS — R2689 Other abnormalities of gait and mobility: Secondary | ICD-10-CM | POA: Diagnosis not present

## 2021-05-25 DIAGNOSIS — R2689 Other abnormalities of gait and mobility: Secondary | ICD-10-CM | POA: Diagnosis not present

## 2021-05-27 DIAGNOSIS — Z0389 Encounter for observation for other suspected diseases and conditions ruled out: Secondary | ICD-10-CM | POA: Diagnosis not present

## 2021-05-27 DIAGNOSIS — K7402 Hepatic fibrosis, advanced fibrosis: Secondary | ICD-10-CM | POA: Diagnosis not present

## 2021-05-27 DIAGNOSIS — R270 Ataxia, unspecified: Secondary | ICD-10-CM | POA: Diagnosis not present

## 2021-05-28 DIAGNOSIS — R2689 Other abnormalities of gait and mobility: Secondary | ICD-10-CM | POA: Diagnosis not present

## 2021-06-01 DIAGNOSIS — R2689 Other abnormalities of gait and mobility: Secondary | ICD-10-CM | POA: Diagnosis not present

## 2021-06-05 DIAGNOSIS — R2689 Other abnormalities of gait and mobility: Secondary | ICD-10-CM | POA: Diagnosis not present

## 2021-06-08 DIAGNOSIS — R2689 Other abnormalities of gait and mobility: Secondary | ICD-10-CM | POA: Diagnosis not present

## 2021-06-11 DIAGNOSIS — R2689 Other abnormalities of gait and mobility: Secondary | ICD-10-CM | POA: Diagnosis not present

## 2021-06-12 ENCOUNTER — Ambulatory Visit: Payer: Medicare Other | Admitting: Urology

## 2021-06-17 ENCOUNTER — Other Ambulatory Visit: Payer: Self-pay

## 2021-06-17 ENCOUNTER — Ambulatory Visit (INDEPENDENT_AMBULATORY_CARE_PROVIDER_SITE_OTHER): Payer: Medicare Other | Admitting: Urology

## 2021-06-17 ENCOUNTER — Encounter: Payer: Self-pay | Admitting: Urology

## 2021-06-17 VITALS — BP 125/68 | HR 91

## 2021-06-17 DIAGNOSIS — N138 Other obstructive and reflux uropathy: Secondary | ICD-10-CM | POA: Diagnosis not present

## 2021-06-17 DIAGNOSIS — R351 Nocturia: Secondary | ICD-10-CM

## 2021-06-17 DIAGNOSIS — N401 Enlarged prostate with lower urinary tract symptoms: Secondary | ICD-10-CM | POA: Diagnosis not present

## 2021-06-17 DIAGNOSIS — R339 Retention of urine, unspecified: Secondary | ICD-10-CM | POA: Diagnosis not present

## 2021-06-17 DIAGNOSIS — N3281 Overactive bladder: Secondary | ICD-10-CM | POA: Diagnosis not present

## 2021-06-17 LAB — URINALYSIS, ROUTINE W REFLEX MICROSCOPIC
Bilirubin, UA: NEGATIVE
Glucose, UA: NEGATIVE
Ketones, UA: NEGATIVE
Leukocytes,UA: NEGATIVE
Nitrite, UA: NEGATIVE
Protein,UA: NEGATIVE
RBC, UA: NEGATIVE
Specific Gravity, UA: 1.02 (ref 1.005–1.030)
Urobilinogen, Ur: 2 mg/dL — ABNORMAL HIGH (ref 0.2–1.0)
pH, UA: 6.5 (ref 5.0–7.5)

## 2021-06-17 LAB — BLADDER SCAN AMB NON-IMAGING

## 2021-06-17 NOTE — Progress Notes (Signed)
06/17/2021 2:25 PM   Joseph Duffy 1956/05/18 [id number]  Referring provider: Glenda Chroman, MD [place],  [place] [address]  Followup BPh and nocturia   HPI: Mr Joseph Duffy is a 65yo here for followuop for BPH and nocturia. Last visit he was started on mirabegron for nocturia which worsened his urinary frequency and did not improve his nocturia. He has OSA but does not wear his CPAP. He has nightly nocturnal enuresis. He stops fluids after 6pm. Urine stream strong. 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 If you have any questions, ask your nurse or doctor.          STOP taking these medications    amoxicillin 500 MG tablet Commonly known as: AMOXIL Stopped by: Nicolette Bang, MD       TAKE these medications    acetaminophen-codeine 300-30 MG tablet Commonly known as: TYLENOL #3 Take 1 tablet by mouth every 6 (six) hours as needed.   ibuprofen 800 MG tablet Commonly known as: ADVIL Take 800 mg by mouth every 8 (eight) hours as needed.   mirabegron ER 25 MG Tb24 tablet Commonly known as: MYRBETRIQ Take 1 tablet (25 mg total) by mouth daily.   multivitamin with minerals tablet Take 1 tablet by mouth daily.   silodosin 8 MG Caps capsule Commonly known as: RAPAFLO Take 1 capsule (8 mg total) by mouth daily with breakfast.   traMADol 50 MG  tablet Commonly known as: Ultram Take 1 tablet (50 mg total) by mouth every 6 (six) hours as needed.   Visine 0.025-0.3 % ophthalmic solution Generic drug: naphazoline-pheniramine Place 1 drop into both eyes 4 (four) times daily as needed for eye irritation.        Allergies: No Known Allergies  Family History: Family History  Problem Relation Age of Onset   COPD Father     Social History:  reports that he has been smoking cigarettes. He has a 13.00 pack-year smoking history. He has never used smokeless tobacco. He reports that he does not currently use alcohol. He reports that he does not use drugs.  ROS: All other review of systems were reviewed and are negative except what is noted above in HPI  Physical Exam: BP 125/68   Pulse 91   Constitutional:  Alert and oriented, No acute distress. HEENT: [place] AT, moist mucus membranes.  Trachea midline, no masses. Cardiovascular: No clubbing, cyanosis, or edema. Respiratory: Normal respiratory effort, no increased work of breathing. GI: Abdomen is soft, nontender, nondistended, no abdominal masses GU: No CVA tenderness.  Lymph: No cervical or inguinal lymphadenopathy. Skin: No rashes, bruises or suspicious lesions. Neurologic: Grossly intact, no focal deficits, moving all 4 extremities. Psychiatric: Normal mood and affect.  Laboratory Data: Lab Results  Component Value Date   WBC 7.1 12/25/2020   HGB 15.1 12/25/2020   HCT 44.5 12/25/2020   MCV 96.1 12/25/2020   PLT 183 12/25/2020    Lab Results  Component Value Date   CREATININE 0.83 12/25/2020    No results found for: PSA  No results found for: TESTOSTERONE  Lab Results  Component Value Date   HGBA1C 5.4 11/20/2019    Urinalysis    Component Value Date/Time   COLORURINE YELLOW 04/06/2016 1336   APPEARANCEUR Clear 04/22/2021 1534   LABSPEC 1.025 04/06/2016 1336   PHURINE 5.5 04/06/2016 1336   GLUCOSEU Negative 04/22/2021 1534   HGBUR NEGATIVE 04/06/2016  1336   BILIRUBINUR Negative 04/22/2021 1534   KETONESUR NEGATIVE 04/06/2016 1336   PROTEINUR Negative 04/22/2021 1534   PROTEINUR NEGATIVE 04/06/2016 1336   NITRITE Negative 04/22/2021 1534   NITRITE NEGATIVE 04/06/2016 1336   LEUKOCYTESUR Negative 04/22/2021 1534    Lab Results  Component Value Date   LABMICR Comment 04/22/2021   WBCUA None seen 02/02/2021   LABEPIT None seen 02/02/2021   BACTERIA None seen 02/02/2021    Pertinent Imaging:  No results found for this or any previous visit.  No results found for this or any previous visit.  No results found for this or any previous visit.  No results found for this or any previous visit.  No results found for this or any previous visit.  No results found for this or any previous visit.  No results found for this or any previous visit.  No results found for this or any previous visit.   Assessment & Plan:    1. OAB (overactive bladder) -Patient defers treatment at this time - Urinalysis, Routine w reflex microscopic - BLADDER SCAN AMB NON-IMAGING  2. Incomplete emptying of bladder -Continue double voiding  - Urinalysis, Routine w reflex microscopic - BLADDER SCAN AMB NON-IMAGING  3. Benign prostatic hyperplasia with urinary obstruction -Improved after urolift  4. Nocturia -Patient counseled to start wearing his CPAP   No follow-ups on file.  Nicolette Bang, MD  [facility] [place]

## 2021-06-17 NOTE — Patient Instructions (Signed)

## 2021-06-17 NOTE — Progress Notes (Signed)
Urological Symptom Review  Patient is experiencing the following symptoms: Leakage of urine   Review of Systems  Gastrointestinal (upper)  : Negative for upper GI symptoms  Gastrointestinal (lower) : Negative for lower GI symptoms  Constitutional : Negative for symptoms  Skin: Negative for skin symptoms  Eyes: Negative for eye symptoms  Ear/Nose/Throat : Negative for Ear/Nose/Throat symptoms  Hematologic/Lymphatic: Negative for Hematologic/Lymphatic symptoms  Cardiovascular : Negative for cardiovascular symptoms  Respiratory : Negative for respiratory symptoms  Endocrine: Negative for endocrine symptoms  Musculoskeletal: Negative for musculoskeletal symptoms  Neurological: Negative for neurological symptoms  Psychologic: Negative for psychiatric symptoms  

## 2021-06-17 NOTE — Progress Notes (Signed)
post void residual= 169ML

## 2021-08-03 ENCOUNTER — Ambulatory Visit: Payer: Medicare Other | Admitting: Urology

## 2021-08-03 ENCOUNTER — Other Ambulatory Visit: Payer: Self-pay

## 2021-08-03 ENCOUNTER — Encounter: Payer: Self-pay | Admitting: Urology

## 2021-08-03 VITALS — BP 148/91 | HR 86 | Ht 72.0 in | Wt 210.0 lb

## 2021-08-03 DIAGNOSIS — R339 Retention of urine, unspecified: Secondary | ICD-10-CM

## 2021-08-03 DIAGNOSIS — N3281 Overactive bladder: Secondary | ICD-10-CM | POA: Diagnosis not present

## 2021-08-03 DIAGNOSIS — N138 Other obstructive and reflux uropathy: Secondary | ICD-10-CM | POA: Diagnosis not present

## 2021-08-03 DIAGNOSIS — N401 Enlarged prostate with lower urinary tract symptoms: Secondary | ICD-10-CM

## 2021-08-03 LAB — URINALYSIS, ROUTINE W REFLEX MICROSCOPIC
Bilirubin, UA: NEGATIVE
Glucose, UA: NEGATIVE
Ketones, UA: NEGATIVE
Leukocytes,UA: NEGATIVE
Nitrite, UA: NEGATIVE
Protein,UA: NEGATIVE
RBC, UA: NEGATIVE
Specific Gravity, UA: 1.02 (ref 1.005–1.030)
Urobilinogen, Ur: 0.2 mg/dL (ref 0.2–1.0)
pH, UA: 6 (ref 5.0–7.5)

## 2021-08-03 LAB — BLADDER SCAN AMB NON-IMAGING: Scan Result: 463

## 2021-08-03 MED ORDER — SILODOSIN 8 MG PO CAPS: 8.0000 mg | ORAL_CAPSULE | Freq: Every day | ORAL | 11 refills | Status: AC

## 2021-08-03 NOTE — Progress Notes (Signed)
post void residual= 463  Urological Symptom Review  Patient is experiencing the following symptoms: Leakage of urine   Review of Systems  Gastrointestinal (upper)  : Negative for upper GI symptoms  Gastrointestinal (lower) : Negative for lower GI symptoms  Constitutional : Negative for symptoms  Skin: Negative for skin symptoms  Eyes: Negative for eye symptoms  Ear/Nose/Throat : Negative for Ear/Nose/Throat symptoms  Hematologic/Lymphatic: Negative for Hematologic/Lymphatic symptoms  Cardiovascular : Negative for cardiovascular symptoms  Respiratory : Negative for respiratory symptoms  Endocrine: Negative for endocrine symptoms  Musculoskeletal: Negative for musculoskeletal symptoms  Neurological: Negative for neurological symptoms  Psychologic: Negative for psychiatric symptoms

## 2021-08-03 NOTE — Progress Notes (Signed)
08/03/2021 8:46 AM   Joseph Duffy 09-29-1955 [id number]  Referring provider: Glenda Chroman, MD [place],  [place] [address]  Followup BPH   HPI: Mr Mcewen is a 66yo here for followup for BPH with incomplete emptying and nocturia. IPSS 14 QOL 4. He has an intermittent weak stream. He is on a CPAP machine now and nocturia is 2x. He has been on the CPAP for 2 weeks. He has nocturnal enuresis. He has urinary frequency but no urinary urgency. He has rare urge incontinence. PVR 463cc.  He stopped drinking fluids after 5pm. He stopped the rapaflo 8mg .    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 If you have any questions, ask your nurse or doctor.          acetaminophen-codeine 300-30 MG tablet Commonly known as: TYLENOL #3 Take 1 tablet by mouth every 6 (six) hours as needed.   ibuprofen 800 MG tablet Commonly known as: ADVIL Take 800 mg by mouth every 8 (eight) hours as needed.   mirabegron ER 25 MG Tb24 tablet Commonly known as: MYRBETRIQ Take 1 tablet (25 mg total) by mouth daily.   multivitamin with minerals tablet Take 1 tablet by mouth daily.   silodosin 8 MG Caps capsule Commonly known as: RAPAFLO Take 1 capsule (8 mg total) by mouth daily with breakfast.   traMADol 50 MG tablet Commonly known as: Ultram Take 1 tablet (50 mg total) by mouth every 6 (six) hours as  needed.   Visine 0.025-0.3 % ophthalmic solution Generic drug: naphazoline-pheniramine Place 1 drop into both eyes 4 (four) times daily as needed for eye irritation.        Allergies: No Known Allergies  Family History: Family History  Problem Relation Age of Onset   COPD Father     Social History:  reports that he has been smoking cigarettes. He has a 13.00 pack-year smoking history. He has never used smokeless tobacco. He reports that he does not currently use alcohol. He reports that he does not use drugs.  ROS: All other review of systems were reviewed and are negative except what is noted above in HPI  Physical Exam: BP (!) 148/91    Pulse 86    Ht 6' (1.829 m)    Wt 210 lb (95.3 kg)    BMI 28.48 kg/m   Constitutional:  Alert and oriented, No acute distress. HEENT: [place] AT, moist mucus membranes.  Trachea midline, no masses. Cardiovascular: No clubbing, cyanosis, or edema. Respiratory: Normal respiratory effort, no increased work of breathing. GI: Abdomen is soft, nontender, nondistended, no abdominal masses GU: No CVA tenderness.  Lymph: No cervical or inguinal lymphadenopathy. Skin: No rashes, bruises or suspicious lesions. Neurologic: Grossly intact, no focal deficits, moving  all 4 extremities. Psychiatric: Normal mood and affect.  Laboratory Data: Lab Results  Component Value Date   WBC 7.1 12/25/2020   HGB 15.1 12/25/2020   HCT 44.5 12/25/2020   MCV 96.1 12/25/2020   PLT 183 12/25/2020    Lab Results  Component Value Date   CREATININE 0.83 12/25/2020    No results found for: PSA  No results found for: TESTOSTERONE  Lab Results  Component Value Date   HGBA1C 5.4 11/20/2019    Urinalysis    Component Value Date/Time   COLORURINE YELLOW 04/06/2016 1336   APPEARANCEUR Clear 06/17/2021 1352   LABSPEC 1.025 04/06/2016 1336   PHURINE 5.5 04/06/2016 1336   GLUCOSEU Negative 06/17/2021 1352   HGBUR NEGATIVE 04/06/2016 1336   BILIRUBINUR Negative  06/17/2021 1352   KETONESUR NEGATIVE 04/06/2016 1336   PROTEINUR Negative 06/17/2021 1352   PROTEINUR NEGATIVE 04/06/2016 1336   NITRITE Negative 06/17/2021 1352   NITRITE NEGATIVE 04/06/2016 1336   LEUKOCYTESUR Negative 06/17/2021 1352    Lab Results  Component Value Date   LABMICR Comment 06/17/2021   WBCUA None seen 02/02/2021   LABEPIT None seen 02/02/2021   BACTERIA None seen 02/02/2021    Pertinent Imaging:  No results found for this or any previous visit.  No results found for this or any previous visit.  No results found for this or any previous visit.  No results found for this or any previous visit.  No results found for this or any previous visit.  No results found for this or any previous visit.  No results found for this or any previous visit.  No results found for this or any previous visit.   Assessment & Plan:    1. Incomplete emptying of bladder -Restart rapaflo 8mg  qhs - Urinalysis, Routine w reflex microscopic - BLADDER SCAN AMB NON-IMAGING  2. Benign prostatic hyperplasia with urinary obstruction -restart rapaflo 8mg  qhs    No follow-ups on file.  Nicolette Bang, MD  [facility]

## 2021-08-03 NOTE — Patient Instructions (Signed)

## 2021-08-04 DIAGNOSIS — G3281 Cerebellar ataxia in diseases classified elsewhere: Secondary | ICD-10-CM | POA: Diagnosis not present

## 2021-08-04 DIAGNOSIS — G510 Bell's palsy: Secondary | ICD-10-CM | POA: Diagnosis not present

## 2021-08-04 DIAGNOSIS — G4700 Insomnia, unspecified: Secondary | ICD-10-CM | POA: Diagnosis not present

## 2021-08-04 DIAGNOSIS — G4733 Obstructive sleep apnea (adult) (pediatric): Secondary | ICD-10-CM | POA: Diagnosis not present

## 2021-08-04 DIAGNOSIS — K219 Gastro-esophageal reflux disease without esophagitis: Secondary | ICD-10-CM | POA: Diagnosis not present

## 2021-08-17 DIAGNOSIS — G4733 Obstructive sleep apnea (adult) (pediatric): Secondary | ICD-10-CM | POA: Diagnosis not present

## 2021-08-20 DIAGNOSIS — I469 Cardiac arrest, cause unspecified: Secondary | ICD-10-CM | POA: Diagnosis not present

## 2021-08-20 DIAGNOSIS — R404 Transient alteration of awareness: Secondary | ICD-10-CM | POA: Diagnosis not present

## 2021-08-20 DIAGNOSIS — R0689 Other abnormalities of breathing: Secondary | ICD-10-CM | POA: Diagnosis not present

## 2021-09-09 DIAGNOSIS — 419620001 Death: Secondary | SNOMED CT | POA: Diagnosis not present

## 2021-09-09 DEATH — deceased

## 2021-10-05 ENCOUNTER — Ambulatory Visit: Payer: Medicare Other | Admitting: Urology

## 2021-10-05 DIAGNOSIS — N401 Enlarged prostate with lower urinary tract symptoms: Secondary | ICD-10-CM

## 2021-10-05 DIAGNOSIS — R339 Retention of urine, unspecified: Secondary | ICD-10-CM

## 2021-10-06 ENCOUNTER — Ambulatory Visit: Payer: Medicare Other | Admitting: Urology
